# Patient Record
Sex: Male | Born: 1960 | Race: White | Hispanic: No | Marital: Single | Smoking: Never smoker
Health system: Southern US, Community
[De-identification: ages and names within clinical notes are randomized; demographics above are authoritative.]

## PROBLEM LIST (undated history)

## (undated) DIAGNOSIS — F329 Major depressive disorder, single episode, unspecified: Secondary | ICD-10-CM

## (undated) DIAGNOSIS — F32A Depression, unspecified: Secondary | ICD-10-CM

---

## 2019-01-14 ENCOUNTER — Emergency Department (HOSPITAL_COMMUNITY): Payer: BC Managed Care – PPO

## 2019-01-14 ENCOUNTER — Other Ambulatory Visit: Payer: Self-pay

## 2019-01-14 ENCOUNTER — Inpatient Hospital Stay (HOSPITAL_COMMUNITY)
Admission: EM | Admit: 2019-01-14 | Discharge: 2019-01-15 | DRG: 605 | Disposition: A | Discharge status: Other Acute Inpatient Hospital | Payer: BC Managed Care – PPO | Attending: General Surgery | Admitting: General Surgery

## 2019-01-14 ENCOUNTER — Encounter (HOSPITAL_COMMUNITY): Payer: Self-pay | Admitting: Emergency Medicine

## 2019-01-14 DIAGNOSIS — Z66 Do not resuscitate: Secondary | ICD-10-CM | POA: Diagnosis present

## 2019-01-14 DIAGNOSIS — F1024 Alcohol dependence with alcohol-induced mood disorder: Secondary | ICD-10-CM | POA: Diagnosis not present

## 2019-01-14 DIAGNOSIS — F10239 Alcohol dependence with withdrawal, unspecified: Secondary | ICD-10-CM | POA: Diagnosis not present

## 2019-01-14 DIAGNOSIS — T1491XA Suicide attempt, initial encounter: Secondary | ICD-10-CM | POA: Diagnosis not present

## 2019-01-14 DIAGNOSIS — F419 Anxiety disorder, unspecified: Secondary | ICD-10-CM | POA: Diagnosis present

## 2019-01-14 DIAGNOSIS — X781XXA Intentional self-harm by knife, initial encounter: Secondary | ICD-10-CM | POA: Diagnosis not present

## 2019-01-14 DIAGNOSIS — F322 Major depressive disorder, single episode, severe without psychotic features: Secondary | ICD-10-CM | POA: Diagnosis not present

## 2019-01-14 DIAGNOSIS — F332 Major depressive disorder, recurrent severe without psychotic features: Secondary | ICD-10-CM | POA: Diagnosis not present

## 2019-01-14 DIAGNOSIS — Z7289 Other problems related to lifestyle: Secondary | ICD-10-CM

## 2019-01-14 DIAGNOSIS — Z915 Personal history of self-harm: Secondary | ICD-10-CM

## 2019-01-14 DIAGNOSIS — F329 Major depressive disorder, single episode, unspecified: Secondary | ICD-10-CM | POA: Diagnosis present

## 2019-01-14 DIAGNOSIS — S21119A Laceration without foreign body of unspecified front wall of thorax without penetration into thoracic cavity, initial encounter: Secondary | ICD-10-CM | POA: Diagnosis not present

## 2019-01-14 DIAGNOSIS — Z23 Encounter for immunization: Secondary | ICD-10-CM | POA: Diagnosis not present

## 2019-01-14 DIAGNOSIS — Z818 Family history of other mental and behavioral disorders: Secondary | ICD-10-CM

## 2019-01-14 DIAGNOSIS — Z6839 Body mass index (BMI) 39.0-39.9, adult: Secondary | ICD-10-CM

## 2019-01-14 DIAGNOSIS — S20219A Contusion of unspecified front wall of thorax, initial encounter: Secondary | ICD-10-CM | POA: Diagnosis present

## 2019-01-14 DIAGNOSIS — IMO0002 Reserved for concepts with insufficient information to code with codable children: Secondary | ICD-10-CM

## 2019-01-14 DIAGNOSIS — Z79899 Other long term (current) drug therapy: Secondary | ICD-10-CM

## 2019-01-14 DIAGNOSIS — E669 Obesity, unspecified: Secondary | ICD-10-CM | POA: Diagnosis present

## 2019-01-14 DIAGNOSIS — F17200 Nicotine dependence, unspecified, uncomplicated: Secondary | ICD-10-CM | POA: Diagnosis not present

## 2019-01-14 DIAGNOSIS — Y908 Blood alcohol level of 240 mg/100 ml or more: Secondary | ICD-10-CM | POA: Diagnosis present

## 2019-01-14 DIAGNOSIS — F10129 Alcohol abuse with intoxication, unspecified: Secondary | ICD-10-CM | POA: Diagnosis present

## 2019-01-14 DIAGNOSIS — K429 Umbilical hernia without obstruction or gangrene: Secondary | ICD-10-CM | POA: Diagnosis present

## 2019-01-14 DIAGNOSIS — F10920 Alcohol use, unspecified with intoxication, uncomplicated: Secondary | ICD-10-CM

## 2019-01-14 DIAGNOSIS — S21112A Laceration without foreign body of left front wall of thorax without penetration into thoracic cavity, initial encounter: Secondary | ICD-10-CM | POA: Diagnosis present

## 2019-01-14 DIAGNOSIS — F4310 Post-traumatic stress disorder, unspecified: Secondary | ICD-10-CM | POA: Diagnosis not present

## 2019-01-14 DIAGNOSIS — G4700 Insomnia, unspecified: Secondary | ICD-10-CM | POA: Diagnosis not present

## 2019-01-14 DIAGNOSIS — J301 Allergic rhinitis due to pollen: Secondary | ICD-10-CM | POA: Diagnosis not present

## 2019-01-14 HISTORY — DX: Depression, unspecified: F32.A

## 2019-01-14 HISTORY — DX: Major depressive disorder, single episode, unspecified: F32.9

## 2019-01-14 LAB — CBC
HCT: 47.6 % (ref 39.0–52.0)
HCT: 40.1 % (ref 39.0–52.0)
Hemoglobin: 15.6 g/dL (ref 13.0–17.0)
Hemoglobin: 13.5 g/dL (ref 13.0–17.0)
MCH: 31.1 pg (ref 26.0–34.0)
MCH: 31 pg (ref 26.0–34.0)
MCHC: 32.8 g/dL (ref 30.0–36.0)
MCHC: 33.7 g/dL (ref 30.0–36.0)
MCV: 94.8 fL (ref 80.0–100.0)
MCV: 92 fL (ref 80.0–100.0)
Platelets: 246 10*3/uL (ref 150–400)
Platelets: 197 10*3/uL (ref 150–400)
RBC: 5.02 MIL/uL (ref 4.22–5.81)
RBC: 4.36 MIL/uL (ref 4.22–5.81)
RDW: 13 % (ref 11.5–15.5)
RDW: 12.9 % (ref 11.5–15.5)
WBC: 11.4 10*3/uL — ABNORMAL HIGH (ref 4.0–10.5)
WBC: 11.3 10*3/uL — ABNORMAL HIGH (ref 4.0–10.5)
nRBC: 0 % (ref 0.0–0.2)
nRBC: 0 % (ref 0.0–0.2)

## 2019-01-14 LAB — PREPARE FRESH FROZEN PLASMA
Unit division: 0
Unit division: 0

## 2019-01-14 LAB — URINALYSIS, ROUTINE W REFLEX MICROSCOPIC
Bacteria, UA: NONE SEEN
Bilirubin Urine: NEGATIVE
Glucose, UA: NEGATIVE mg/dL
Hgb urine dipstick: NEGATIVE
Ketones, ur: 80 mg/dL — AB
Leukocytes,Ua: NEGATIVE
Nitrite: NEGATIVE
Protein, ur: 30 mg/dL — AB
Specific Gravity, Urine: 1.024 (ref 1.005–1.030)
pH: 5 (ref 5.0–8.0)

## 2019-01-14 LAB — TYPE AND SCREEN
ABO/RH(D): O POS
ANTIBODY SCREEN: NEGATIVE
Unit division: 0
Unit division: 0

## 2019-01-14 LAB — GLUCOSE, CAPILLARY: Glucose-Capillary: 151 mg/dL — ABNORMAL HIGH (ref 70–99)

## 2019-01-14 LAB — COMPREHENSIVE METABOLIC PANEL
ALT: 24 U/L (ref 0–44)
AST: 32 U/L (ref 15–41)
Albumin: 4.5 g/dL (ref 3.5–5.0)
Alkaline Phosphatase: 80 U/L (ref 38–126)
Anion gap: 15 (ref 5–15)
BUN: 21 mg/dL — AB (ref 6–20)
CO2: 22 mmol/L (ref 22–32)
Calcium: 8.7 mg/dL — ABNORMAL LOW (ref 8.9–10.3)
Chloride: 97 mmol/L — ABNORMAL LOW (ref 98–111)
Creatinine, Ser: 1.04 mg/dL (ref 0.61–1.24)
GFR calc Af Amer: >60 mL/min (ref >60)
GFR calc non Af Amer: >60 mL/min (ref >60)
Glucose, Bld: 148 mg/dL — ABNORMAL HIGH (ref 70–99)
Potassium: 3.5 mmol/L (ref 3.5–5.1)
Sodium: 134 mmol/L — ABNORMAL LOW (ref 135–145)
Total Bilirubin: 1.1 mg/dL (ref 0.3–1.2)
Total Protein: 7.6 g/dL (ref 6.5–8.1)

## 2019-01-14 LAB — RAPID URINE DRUG SCREEN, HOSP PERFORMED
Amphetamines: NOT DETECTED
BARBITURATES: NOT DETECTED
Benzodiazepines: POSITIVE — AB
Cocaine: NOT DETECTED
Opiates: POSITIVE — AB
Tetrahydrocannabinol: NOT DETECTED

## 2019-01-14 LAB — BPAM RBC
Blood Product Expiration Date: 202004172359
Blood Product Expiration Date: 202004172359
ISSUE DATE / TIME: 202003310153
ISSUE DATE / TIME: 202003310449
Unit Type and Rh: 5100
Unit Type and Rh: 5100

## 2019-01-14 LAB — BPAM FFP
Blood Product Expiration Date: 202004042359
Blood Product Expiration Date: 202004052359
ISSUE DATE / TIME: 202003310153
ISSUE DATE / TIME: 202003310153
Unit Type and Rh: 6200
Unit Type and Rh: 6200

## 2019-01-14 LAB — CDS SEROLOGY

## 2019-01-14 LAB — BLOOD PRODUCT ORDER (VERBAL) VERIFICATION

## 2019-01-14 LAB — HIV ANTIBODY (ROUTINE TESTING W REFLEX): HIV Screen 4th Generation wRfx: NONREACTIVE

## 2019-01-14 LAB — ETHANOL: Alcohol, Ethyl (B): 289 mg/dL — ABNORMAL HIGH (ref <10)

## 2019-01-14 LAB — LACTIC ACID, PLASMA: Lactic Acid, Venous: 5 mmol/L (ref 0.5–1.9)

## 2019-01-14 LAB — PROTIME-INR
INR: 1 (ref 0.8–1.2)
Prothrombin Time: 12.6 seconds (ref 11.4–15.2)

## 2019-01-14 LAB — ABO/RH: ABO/RH(D): O POS

## 2019-01-14 MED ORDER — FOLIC ACID 5 MG/ML IJ SOLN
1.0000 mg | Freq: Once | INTRAMUSCULAR | Status: AC
  Administered 2019-01-14: 1 mg via INTRAVENOUS
  Filled 2019-01-14: qty 0.2

## 2019-01-14 MED ORDER — FOLIC ACID 1 MG PO TABS
1.0000 mg | ORAL_TABLET | Freq: Every day | ORAL | Status: DC
  Administered 2019-01-14 – 2019-01-15 (×2): 1 mg via ORAL
  Filled 2019-01-14 (×2): qty 1

## 2019-01-14 MED ORDER — SODIUM CHLORIDE 0.9 % IV SOLN
INTRAVENOUS | Status: DC
  Administered 2019-01-14 – 2019-01-15 (×3): via INTRAVENOUS

## 2019-01-14 MED ORDER — ACETAMINOPHEN 325 MG PO TABS: 650.0000 mg | ORAL_TABLET | ORAL | Status: DC | PRN

## 2019-01-14 MED ORDER — ONDANSETRON HCL 4 MG/2ML IJ SOLN: 4.0000 mg | Freq: Four times a day (QID) | INTRAMUSCULAR | Status: DC | PRN

## 2019-01-14 MED ORDER — SODIUM CHLORIDE 0.9 % IV BOLUS (SEPSIS)
1000.0000 mL | Freq: Once | INTRAVENOUS | Status: AC
  Administered 2019-01-14: 1000 mL via INTRAVENOUS

## 2019-01-14 MED ORDER — FENTANYL CITRATE (PF) 100 MCG/2ML IJ SOLN
50.0000 ug | Freq: Once | INTRAMUSCULAR | Status: AC
  Administered 2019-01-14: 50 ug via INTRAVENOUS
  Filled 2019-01-14: qty 2

## 2019-01-14 MED ORDER — ONDANSETRON 4 MG PO TBDP: 4.0000 mg | ORAL_TABLET | Freq: Four times a day (QID) | ORAL | Status: DC | PRN

## 2019-01-14 MED ORDER — SODIUM CHLORIDE 0.9 % IV SOLN: 1.0000 mg | Freq: Once | INTRAVENOUS | Status: DC

## 2019-01-14 MED ORDER — ONDANSETRON HCL 4 MG/2ML IJ SOLN
4.0000 mg | Freq: Once | INTRAMUSCULAR | Status: AC
  Administered 2019-01-14: 4 mg via INTRAVENOUS
  Filled 2019-01-14: qty 2

## 2019-01-14 MED ORDER — TETANUS-DIPHTH-ACELL PERTUSSIS 5-2.5-18.5 LF-MCG/0.5 IM SUSP
0.5000 mL | Freq: Once | INTRAMUSCULAR | Status: AC
  Administered 2019-01-14: 0.5 mL via INTRAMUSCULAR
  Filled 2019-01-14: qty 0.5

## 2019-01-14 MED ORDER — LORAZEPAM 1 MG PO TABS: 1.0000 mg | ORAL_TABLET | Freq: Four times a day (QID) | ORAL | Status: DC | PRN

## 2019-01-14 MED ORDER — ENOXAPARIN SODIUM 40 MG/0.4ML ~~LOC~~ SOLN
40.0000 mg | SUBCUTANEOUS | Status: DC
  Administered 2019-01-14: 40 mg via SUBCUTANEOUS
  Filled 2019-01-14: qty 0.4

## 2019-01-14 MED ORDER — IOHEXOL 350 MG/ML SOLN
100.0000 mL | Freq: Once | INTRAVENOUS | Status: AC | PRN
  Administered 2019-01-14: 100 mL via INTRAVENOUS

## 2019-01-14 MED ORDER — OXYCODONE HCL 5 MG PO TABS: 5.0000 mg | ORAL_TABLET | ORAL | Status: DC | PRN

## 2019-01-14 MED ORDER — ADULT MULTIVITAMIN W/MINERALS CH
1.0000 | ORAL_TABLET | Freq: Every day | ORAL | Status: DC
  Administered 2019-01-14 – 2019-01-15 (×2): 1 via ORAL
  Filled 2019-01-14 (×2): qty 1

## 2019-01-14 MED ORDER — CEFAZOLIN SODIUM-DEXTROSE 2-4 GM/100ML-% IV SOLN
2.0000 g | Freq: Once | INTRAVENOUS | Status: AC
  Administered 2019-01-14: 2 g via INTRAVENOUS
  Filled 2019-01-14: qty 100

## 2019-01-14 MED ORDER — BUSPIRONE HCL 15 MG PO TABS
7.5000 mg | ORAL_TABLET | Freq: Two times a day (BID) | ORAL | Status: DC
  Administered 2019-01-14 – 2019-01-15 (×2): 7.5 mg via ORAL
  Filled 2019-01-14 (×3): qty 1

## 2019-01-14 MED ORDER — VITAMIN B-1 100 MG PO TABS
100.0000 mg | ORAL_TABLET | Freq: Every day | ORAL | Status: DC
  Administered 2019-01-14 – 2019-01-15 (×2): 100 mg via ORAL
  Filled 2019-01-14 (×2): qty 1

## 2019-01-14 MED ORDER — THIAMINE HCL 100 MG/ML IJ SOLN: 100.0000 mg | Freq: Every day | INTRAMUSCULAR | Status: DC

## 2019-01-14 MED ORDER — LORAZEPAM 2 MG/ML IJ SOLN
1.0000 mg | Freq: Four times a day (QID) | INTRAMUSCULAR | Status: DC | PRN
  Administered 2019-01-14: 1 mg via INTRAVENOUS
  Filled 2019-01-14: qty 1

## 2019-01-14 MED ORDER — SERTRALINE HCL 100 MG PO TABS
200.0000 mg | ORAL_TABLET | Freq: Every day | ORAL | Status: DC
  Administered 2019-01-14 – 2019-01-15 (×2): 200 mg via ORAL
  Filled 2019-01-14 (×2): qty 2

## 2019-01-14 MED ORDER — BACITRACIN ZINC 500 UNIT/GM EX OINT
TOPICAL_OINTMENT | Freq: Every day | CUTANEOUS | Status: DC
  Administered 2019-01-14 – 2019-01-15 (×2): via TOPICAL
  Filled 2019-01-14: qty 28.35

## 2019-01-14 MED ORDER — DOCUSATE SODIUM 100 MG PO CAPS
100.0000 mg | ORAL_CAPSULE | Freq: Two times a day (BID) | ORAL | Status: DC
  Administered 2019-01-14 – 2019-01-15 (×3): 100 mg via ORAL
  Filled 2019-01-14 (×3): qty 1

## 2019-01-14 MED ORDER — MORPHINE SULFATE (PF) 2 MG/ML IV SOLN
1.0000 mg | INTRAVENOUS | Status: DC | PRN
  Administered 2019-01-14: 2 mg via INTRAVENOUS
  Filled 2019-01-14: qty 1

## 2019-01-14 MED ORDER — HYDRALAZINE HCL 20 MG/ML IJ SOLN: 10.0000 mg | INTRAMUSCULAR | Status: DC | PRN
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
## 2019-01-15 ENCOUNTER — Other Ambulatory Visit: Payer: Self-pay | Admitting: Registered Nurse

## 2019-01-15 ENCOUNTER — Inpatient Hospital Stay (HOSPITAL_COMMUNITY)
Admission: AD | Admit: 2019-01-15 | Discharge: 2019-01-20 | DRG: 885 | Disposition: A | Discharge status: Home / Self Care | Payer: BC Managed Care – PPO | Source: Intra-hospital | Attending: Psychiatry | Admitting: Psychiatry

## 2019-01-15 ENCOUNTER — Other Ambulatory Visit: Payer: Self-pay

## 2019-01-15 ENCOUNTER — Encounter (HOSPITAL_COMMUNITY): Payer: Self-pay | Admitting: *Deleted

## 2019-01-15 DIAGNOSIS — J301 Allergic rhinitis due to pollen: Secondary | ICD-10-CM | POA: Diagnosis present

## 2019-01-15 DIAGNOSIS — Z915 Personal history of self-harm: Secondary | ICD-10-CM | POA: Diagnosis not present

## 2019-01-15 DIAGNOSIS — F1024 Alcohol dependence with alcohol-induced mood disorder: Secondary | ICD-10-CM | POA: Diagnosis present

## 2019-01-15 DIAGNOSIS — T1491XA Suicide attempt, initial encounter: Secondary | ICD-10-CM | POA: Diagnosis not present

## 2019-01-15 DIAGNOSIS — F17200 Nicotine dependence, unspecified, uncomplicated: Secondary | ICD-10-CM | POA: Diagnosis present

## 2019-01-15 DIAGNOSIS — X781XXA Intentional self-harm by knife, initial encounter: Secondary | ICD-10-CM

## 2019-01-15 DIAGNOSIS — F10239 Alcohol dependence with withdrawal, unspecified: Secondary | ICD-10-CM | POA: Diagnosis present

## 2019-01-15 DIAGNOSIS — F332 Major depressive disorder, recurrent severe without psychotic features: Secondary | ICD-10-CM | POA: Diagnosis not present

## 2019-01-15 DIAGNOSIS — F4310 Post-traumatic stress disorder, unspecified: Secondary | ICD-10-CM | POA: Diagnosis present

## 2019-01-15 DIAGNOSIS — Y908 Blood alcohol level of 240 mg/100 ml or more: Secondary | ICD-10-CM | POA: Diagnosis present

## 2019-01-15 DIAGNOSIS — IMO0002 Reserved for concepts with insufficient information to code with codable children: Secondary | ICD-10-CM

## 2019-01-15 DIAGNOSIS — Z79899 Other long term (current) drug therapy: Secondary | ICD-10-CM | POA: Diagnosis not present

## 2019-01-15 DIAGNOSIS — G4700 Insomnia, unspecified: Secondary | ICD-10-CM | POA: Diagnosis present

## 2019-01-15 DIAGNOSIS — F322 Major depressive disorder, single episode, severe without psychotic features: Secondary | ICD-10-CM | POA: Diagnosis present

## 2019-01-15 DIAGNOSIS — Z7289 Other problems related to lifestyle: Secondary | ICD-10-CM

## 2019-01-15 DIAGNOSIS — S21119A Laceration without foreign body of unspecified front wall of thorax without penetration into thoracic cavity, initial encounter: Secondary | ICD-10-CM | POA: Diagnosis not present

## 2019-01-15 LAB — BASIC METABOLIC PANEL
Anion gap: 9 (ref 5–15)
BUN: 15 mg/dL (ref 6–20)
CO2: 23 mmol/L (ref 22–32)
Calcium: 8.4 mg/dL — ABNORMAL LOW (ref 8.9–10.3)
Chloride: 103 mmol/L (ref 98–111)
Creatinine, Ser: 0.83 mg/dL (ref 0.61–1.24)
GFR calc Af Amer: >60 mL/min (ref >60)
GFR calc non Af Amer: >60 mL/min (ref >60)
Glucose, Bld: 140 mg/dL — ABNORMAL HIGH (ref 70–99)
Potassium: 3.7 mmol/L (ref 3.5–5.1)
Sodium: 135 mmol/L (ref 135–145)

## 2019-01-15 LAB — CBC
HCT: 34.1 % — ABNORMAL LOW (ref 39.0–52.0)
HEMOGLOBIN: 11.7 g/dL — AB (ref 13.0–17.0)
MCH: 31.9 pg (ref 26.0–34.0)
MCHC: 34.3 g/dL (ref 30.0–36.0)
MCV: 92.9 fL (ref 80.0–100.0)
Platelets: 158 10*3/uL (ref 150–400)
RBC: 3.67 MIL/uL — ABNORMAL LOW (ref 4.22–5.81)
RDW: 12.8 % (ref 11.5–15.5)
WBC: 8.2 10*3/uL (ref 4.0–10.5)
nRBC: 0 % (ref 0.0–0.2)

## 2019-01-15 MED ORDER — LORAZEPAM 1 MG PO TABS: 1.0000 mg | ORAL_TABLET | Freq: Every day | ORAL | Status: DC

## 2019-01-15 MED ORDER — BUSPIRONE HCL 10 MG PO TABS
10.0000 mg | ORAL_TABLET | Freq: Two times a day (BID) | ORAL | Status: DC
  Administered 2019-01-15 – 2019-01-20 (×10): 10 mg via ORAL
  Filled 2019-01-15 (×6): qty 1
  Filled 2019-01-15: qty 2
  Filled 2019-01-15 (×8): qty 1

## 2019-01-15 MED ORDER — THIAMINE HCL 100 MG/ML IJ SOLN: 100.0000 mg | Freq: Once | INTRAMUSCULAR | Status: DC

## 2019-01-15 MED ORDER — ZOLPIDEM TARTRATE 5 MG PO TABS
5.0000 mg | ORAL_TABLET | Freq: Every evening | ORAL | Status: DC | PRN
  Administered 2019-01-15: 5 mg via ORAL
  Filled 2019-01-15: qty 1

## 2019-01-15 MED ORDER — BUSPIRONE HCL 7.5 MG PO TABS: 7.5000 mg | ORAL_TABLET | Freq: Two times a day (BID) | ORAL | Status: DC

## 2019-01-15 MED ORDER — BACITRACIN ZINC 500 UNIT/GM EX OINT: TOPICAL_OINTMENT | Freq: Every day | CUTANEOUS | 0 refills | Status: DC

## 2019-01-15 MED ORDER — BACITRACIN-NEOMYCIN-POLYMYXIN 400-5-5000 EX OINT
TOPICAL_OINTMENT | Freq: Three times a day (TID) | CUTANEOUS | Status: DC
  Filled 2019-01-15: qty 1

## 2019-01-15 MED ORDER — LORAZEPAM 1 MG PO TABS: 1.0000 mg | ORAL_TABLET | Freq: Three times a day (TID) | ORAL | 0 refills | Status: DC | PRN

## 2019-01-15 MED ORDER — LORAZEPAM 1 MG PO TABS: 1.0000 mg | ORAL_TABLET | Freq: Two times a day (BID) | ORAL | Status: DC

## 2019-01-15 MED ORDER — ACETAMINOPHEN 325 MG PO TABS: 650.0000 mg | ORAL_TABLET | ORAL | Status: DC | PRN

## 2019-01-15 MED ORDER — LOPERAMIDE HCL 2 MG PO CAPS: 2.0000 mg | ORAL_CAPSULE | ORAL | Status: AC | PRN

## 2019-01-15 MED ORDER — SERTRALINE HCL 100 MG PO TABS
100.0000 mg | ORAL_TABLET | Freq: Two times a day (BID) | ORAL | Status: DC
  Administered 2019-01-15 – 2019-01-17 (×4): 100 mg via ORAL
  Filled 2019-01-15 (×7): qty 1

## 2019-01-15 MED ORDER — LORAZEPAM 1 MG PO TABS: 1.0000 mg | ORAL_TABLET | Freq: Three times a day (TID) | ORAL | Status: DC

## 2019-01-15 MED ORDER — TRAZODONE HCL 50 MG PO TABS
50.0000 mg | ORAL_TABLET | Freq: Every evening | ORAL | Status: DC | PRN
  Administered 2019-01-15: 50 mg via ORAL
  Filled 2019-01-15 (×7): qty 1

## 2019-01-15 MED ORDER — LORAZEPAM 1 MG PO TABS
1.0000 mg | ORAL_TABLET | Freq: Four times a day (QID) | ORAL | Status: AC | PRN
  Administered 2019-01-15: 1 mg via ORAL
  Filled 2019-01-15: qty 1

## 2019-01-15 MED ORDER — LORAZEPAM 1 MG PO TABS: 1.0000 mg | ORAL_TABLET | Freq: Four times a day (QID) | ORAL | Status: DC

## 2019-01-15 MED ORDER — ADULT MULTIVITAMIN W/MINERALS CH
1.0000 | ORAL_TABLET | Freq: Every day | ORAL | Status: DC
  Administered 2019-01-16 – 2019-01-20 (×5): 1 via ORAL
  Filled 2019-01-15 (×7): qty 1

## 2019-01-15 MED ORDER — HYDROXYZINE HCL 25 MG PO TABS
25.0000 mg | ORAL_TABLET | Freq: Four times a day (QID) | ORAL | Status: AC | PRN
  Administered 2019-01-15 – 2019-01-16 (×2): 25 mg via ORAL
  Filled 2019-01-15 (×3): qty 1

## 2019-01-15 MED ORDER — ONDANSETRON 4 MG PO TBDP: 4.0000 mg | ORAL_TABLET | Freq: Four times a day (QID) | ORAL | Status: AC | PRN

## 2019-01-15 MED ORDER — VITAMIN B-1 100 MG PO TABS
100.0000 mg | ORAL_TABLET | Freq: Every day | ORAL | Status: DC
  Administered 2019-01-16 – 2019-01-20 (×5): 100 mg via ORAL
  Filled 2019-01-15 (×7): qty 1
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
## 2019-01-15 NOTE — Plan of Care (Signed)
  Problem: Health Behavior/Discharge (Transition) Planning: Goal: Ability to manage health-related needs will improve Outcome: Progressing   Problem: Clinical Measurements: Goal: Remain free from any harm during hospitalization Outcome: Progressing   Problem: Nutrition: Goal: Adequate fluids and nutrition will be maintained Outcome: Progressing   Problem: Sleep Hygiene: Goal: Ability to obtain adequate restful sleep will improve Outcome: Progressing   Problem: Education: Goal: Knowledge of General Education information will improve Description Including pain rating scale, medication(s)/side effects and non-pharmacologic comfort measures Outcome: Progressing   Problem: Health Behavior/Discharge Planning: Goal: Ability to manage health-related needs will improve Outcome: Progressing   Problem: Clinical Measurements: Goal: Will remain free from infection Outcome: Progressing Goal: Diagnostic test results will improve Outcome: Progressing Goal: Cardiovascular complication will be avoided Outcome: Progressing   Problem: Activity: Goal: Risk for activity intolerance will decrease Outcome: Progressing   Problem: Nutrition: Goal: Adequate nutrition will be maintained Outcome: Progressing   Problem: Coping: Goal: Level of anxiety will decrease Outcome: Progressing   Problem: Elimination: Goal: Will not experience complications related to bowel motility Outcome: Progressing Goal: Will not experience complications related to urinary retention Outcome: Progressing   Problem: Pain Managment: Goal: General experience of comfort will improve Outcome: Progressing   Problem: Safety: Goal: Ability to remain free from injury will improve Outcome: Progressing   Problem: Skin Integrity: Goal: Risk for impaired skin integrity will decrease Outcome: Progressing

## 2019-01-15 NOTE — Tx Team (Signed)
Initial Treatment Plan 01/15/2019 2:20 PM [place]ington Dohrn QIW:[id number]    PATIENT STRESSORS: Financial difficulties Occupational concerns Substance abuse   PATIENT STRENGTHS: Ability for insight Average or above average intelligence Capable of independent living General fund of knowledge Motivation for treatment/growth   PATIENT IDENTIFIED PROBLEMS: Depression Alcohol abuse Suicidal thoughts "Come up with a plan to stop drinking and balance my meds"                     DISCHARGE CRITERIA:  Ability to meet basic life and health needs Improved stabilization in mood, thinking, and/or behavior Reduction of life-threatening or endangering symptoms to within safe limits Verbal commitment to aftercare and medication compliance Withdrawal symptoms are absent or subacute and managed without 24-hour nursing intervention  PRELIMINARY DISCHARGE PLAN: Attend aftercare/continuing care group Return to previous living arrangement  PATIENT/FAMILY INVOLVEMENT: This treatment plan has been presented to and reviewed with the patient, Tanner Andersen, and/or family member, .  The patient and family have been given the opportunity to ask questions and make suggestions.  Stiven Kaspar, [place], [place] 01/15/2019, 2:20 PM
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
## 2019-01-15 NOTE — TOC Transition Note (Signed)
Transition of Care [facility]) - CM/SW Discharge Note   Patient Details  Name: Tanner Andersen MRN: [id number] Date of Birth: Sep 28, 1961  [facility]) CM/SW Contact:  Legrand Como, LCSW Phone Number: [phone] 01/15/2019, 12:25 PM   Clinical Narrative:    Patient with bed available at [facility] following Psychiatry recommendation for inpatient placement.  Patient remains agreeable with placement at [facility] and signed voluntary form for admission.  CSW to arrange [facility] transport for patient arrival at 13:00.  Patient details for admission are as follows:  Room 307 Bed 1 Dr. Sharma Covert - Accepting MD Dr. Jama Flavors - Admitting MD Number for Report [phone]   Final next level of care: Psychiatric Hospital Barriers to Discharge: No Barriers Identified   Patient Goals and CMS Choice Patient states their goals for this hospitalization and ongoing recovery are:: To get home and back to "normal"   Choice offered to / list presented to : Patient  Discharge Placement              Patient chooses bed at: Other - please specify in the comment section below:([facility]) Patient to be transferred to facility by: [place] Name of family member notified: Patient to notify family Patient and family notified of of transfer: 01/15/19  Discharge Plan and Services      Room 307 Bed 1 Dr. Sharma Covert - Accepting MD Dr. Jama Flavors - Admitting MD Number for Report [phone]                   Social Determinants of Health (SDOH) Interventions     Readmission Risk Interventions No flowsheet data found.

## 2019-01-15 NOTE — Progress Notes (Signed)
Discharge to Behavioral health transported by behavioral transport. Patient was alert and oriented prior to discharge, not in any distress.

## 2019-01-15 NOTE — Discharge Instructions (Signed)
On February 04, 2019 at 9:40am you will receive a phone call for your follow up visit to decrease your exposure during this time of COVID-19 being high.  You will need to take a picture of your wound and email it to [email or web address] the day prior to your appointment.  However, if you begin to noticed redness, purulent drainage, or fevers and feel like your wound is infected then please call so we can adjust this appointment to a possible face to face encounter.     Bacitracin ointment to chest wound and cover with a dry bandage

## 2019-01-15 NOTE — Progress Notes (Signed)
Report given to behavioral health Marchelle Folks, RN

## 2019-01-15 NOTE — Progress Notes (Deleted)
Tanner Andersen to be D/C'd  per MD order. Discussed with the patient and all questions fully answered.  VSS, Skin clean, dry and intact without evidence of skin break down, no evidence of skin tears noted.  IV catheter discontinued intact. Site without signs and symptoms of complications. Dressing and pressure applied.  An After Visit Summary was printed and given to the patient. Patient received prescription.  D/c education completed with patient/family including follow up instructions, medication list, d/c activities limitations if indicated, with other d/c instructions as indicated by MD - patient able to verbalize understanding, all questions fully answered.   Patient instructed to return to ED, call 911, or call MD for any changes in condition.   Patient to be escorted via WC, and D/C home via private auto.

## 2019-01-15 NOTE — Progress Notes (Signed)
EKG completed and given to MD for review.  
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
## 2019-01-16 LAB — HEMOGLOBIN A1C
Hgb A1c MFr Bld: 5.7 % — ABNORMAL HIGH (ref 4.8–5.6)
Mean Plasma Glucose: 116.89 mg/dL

## 2019-01-16 LAB — LIPID PANEL
Cholesterol: 183 mg/dL (ref 0–200)
HDL: 67 mg/dL (ref >40)
LDL Cholesterol: 77 mg/dL (ref 0–99)
Total CHOL/HDL Ratio: 2.7 RATIO
Triglycerides: 197 mg/dL — ABNORMAL HIGH (ref <150)
VLDL: 39 mg/dL (ref 0–40)

## 2019-01-16 LAB — TSH: TSH: 3.297 u[IU]/mL (ref 0.350–4.500)

## 2019-01-16 MED ORDER — BACITRACIN-NEOMYCIN-POLYMYXIN OINTMENT TUBE
TOPICAL_OINTMENT | Freq: Three times a day (TID) | CUTANEOUS | Status: DC
  Administered 2019-01-16: 1 via TOPICAL
  Administered 2019-01-17 – 2019-01-20 (×5): via TOPICAL
  Filled 2019-01-16: qty 14.17

## 2019-01-16 MED ORDER — TRAZODONE HCL 50 MG PO TABS
50.0000 mg | ORAL_TABLET | Freq: Every evening | ORAL | Status: DC | PRN
  Administered 2019-01-16: 50 mg via ORAL
  Filled 2019-01-16: qty 1

## 2019-01-16 MED ORDER — BACITRACIN-NEOMYCIN-POLYMYXIN OINTMENT TUBE
TOPICAL_OINTMENT | CUTANEOUS | Status: AC
  Filled 2019-01-16: qty 14.17

## 2019-01-16 NOTE — Progress Notes (Signed)
Patient ID: Tanner Andersen, male   DOB: 24-Apr-1961, 58 y.o.   MRN: [id number] Wound cleaned and dressing changed. Wound is dry with no discharge. 4/4, neosporin and paper tape applied. Pt denies pain.

## 2019-01-16 NOTE — BHH Group Notes (Signed)
LCSW Group Therapy Notes 01/16/2019 2:02 PM  Type of Therapy and Topic: Group Therapy: Overcoming Obstacles  Participation Level: Did Not Attend  Description of Group:  In this group patients will be encouraged to explore what they see as obstacles to their own wellness and recovery. They will be guided to discuss their thoughts, feelings, and behaviors related to these obstacles. The group will process together ways to cope with barriers, with attention given to specific choices patients can make. Each patient will be challenged to identify changes they are motivated to make in order to overcome their obstacles. This group will be process-oriented, with patients participating in exploration of their own experiences as well as giving and receiving support and challenge from other group members.  Therapeutic Goals: 1. Patient will identify personal and current obstacles as they relate to admission. 2. Patient will identify barriers that currently interfere with their wellness or overcoming obstacles.  3. Patient will identify feelings, thought process and behaviors related to these barriers. 4. Patient will identify two changes they are willing to make to overcome these obstacles:   Summary of Patient Progress Invited, did not attend.   Therapeutic Modalities:  Cognitive Behavioral Therapy Solution Focused Therapy Motivational Interviewing Relapse Prevention Therapy  Keauna Brasel, MSW, LCSWA 01/16/2019 2:02 PM   

## 2019-01-16 NOTE — Plan of Care (Signed)
Problem: Education: Goal: Knowledge of [facility] Education information/materials will improve Outcome: Progressing   Problem: Activity: Goal: Interest or engagement in activities will improve Outcome: Progressing   Problem: Safety: Goal: Periods of time without injury will increase Outcome: Progressing DAR Note: Pt observed in dayroom for majority of this shift. He attended groups as scheduled. Reports he slept well last night "that was the best sleep I've had in days", states his appetite is good. Denies SI, HI, AVH and pain. Rates his anxiety 5/10 and depression 5/10. Pt has been medication compliant, denies adverse drug reactions when assessed. Emotional support and encouragement offered to pt as needed throughout this shift. Scheduled and PRN medications given per MD's order's with verbal education and effects monitored. Dressing change done per order. Pt tolerate dressing change and all PO intake well. POC continues for safety and mood stability. Pt remains safe on and off unit.
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
## 2019-01-16 NOTE — Progress Notes (Signed)
D: Pt was in the dayroom upon initial approach.  Pt presents with depressed affect and mood.  He describes his day as "pretty good."  He reports he "talked to the doc about meds and possibly changing them up and I talked to the social worker."  Pt reports he discussed his aftercare plans with Education officer, museum.  Pt denies SI/HI, denies hallucinations, denies pain, denies withdrawal symptoms.  Pt has been visible in milieu interacting with peers and staff appropriately.   A: Introduced self to pt.  Met with pt 1:1.  Actively listened to pt and offered support and encouragement.  PRN medication administered for sleep.  Q15 minute safety checks maintained.  R: Pt is safe on the unit.  Pt is compliant with medication.  Pt verbally contracts for safety.  Will continue to monitor and assess.
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
## 2019-01-17 MED ORDER — TRAZODONE HCL 50 MG PO TABS
25.0000 mg | ORAL_TABLET | Freq: Every evening | ORAL | Status: DC | PRN
  Administered 2019-01-17 – 2019-01-19 (×3): 25 mg via ORAL
  Filled 2019-01-17 (×3): qty 1

## 2019-01-17 MED ORDER — ACAMPROSATE CALCIUM 333 MG PO TBEC
666.0000 mg | DELAYED_RELEASE_TABLET | Freq: Three times a day (TID) | ORAL | Status: DC
  Administered 2019-01-17 – 2019-01-20 (×9): 666 mg via ORAL
  Filled 2019-01-17 (×14): qty 2

## 2019-01-17 MED ORDER — CITALOPRAM HYDROBROMIDE 10 MG PO TABS
10.0000 mg | ORAL_TABLET | Freq: Every day | ORAL | Status: DC
  Administered 2019-01-18 – 2019-01-20 (×3): 10 mg via ORAL
  Filled 2019-01-17 (×5): qty 1
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
## 2019-01-17 NOTE — Progress Notes (Signed)
D: Pt was in dayroom upon initial approach.  Pt presents with depressed affect and mood.  He brightens with interaction.  He reports he is "doing good" and discusses how his medications were changed today because "I realized I needed a change."  Pt reports he "got outside twice today."  Pt denies SI/HI, denies hallucinations, denies pain.  Pt has been visible in milieu interacting with peers and staff appropriately.  Pt attended evening group.    A: Introduced self to pt.  Met with pt 1:1.  Actively listened to pt and offered support and encouragement.  PRN medication administered for sleep.  Pt provided with journal and workbook.  Q15 minute safety checks maintained.  R: Pt is safe on the unit.  Pt is compliant with medications.  Pt verbally contracts for safety.  Will continue to monitor and assess.

## 2019-01-17 NOTE — Progress Notes (Signed)
Recreation Therapy Notes  Date:  4.3.20 Time: 0930 Location: [address] Dayroom  Group Topic: Stress Management  Goal Area(s) Addresses:  Patient will identify positive stress management techniques. Patient will identify benefits of using stress management post d/c.  Behavioral Response:  Engaged  Intervention: Stress Management  Activity :  Guided Imagery.  LRT read Andersen script that guided patients in envisioning sitting in Andersen field and enjoying summer clouds.  Patients were to follow along as script was read to participate in meditation.  Education:  Stress Management, Discharge Planning.   Education Outcome: Acknowledges Education  Clinical Observations/Feedback:  Pt attended and participated in group.     Tanner Andersen, Tanner Andersen         Tanner Andersen 01/17/2019 11:49 AM 

## 2019-01-17 NOTE — Plan of Care (Addendum)
Patient was pleasant upon approach. Says he slept well, but the Trazodone dosage may have been too much. Denies SI HI AVH. Not quite ready for discharge, but thinks he is getting better. Safety is maintained with 15 minute checks as well as environmental checks. Will continue to monitor.  Problem: Education: Goal: Emotional status will improve Outcome: Progressing Goal: Mental status will improve Outcome: Progressing Goal: Verbalization of understanding the information provided will improve Outcome: Progressing

## 2019-01-17 NOTE — B[facility] Group Notes (Signed)
Adult Psychoeducational Group Note  Date:  01/17/2019 Time:  8:43 PM  Group Topic/Focus:  Wrap-Up Group:   The focus of this group is to help patients review their daily goal of treatment and discuss progress on daily workbooks.  Participation Level:  Active  Participation Quality:  Appropriate and Attentive  Affect:  Appropriate  Cognitive:  Alert and Appropriate  Insight: Appropriate and Good  Engagement in Group:  Engaged  Modes of Intervention:  Discussion and Education  Additional Comments:  Pt attended and participated in wrap up group this evening. Pt rated their day a 7/10. Pt goal is to make a plan for themselves, follow their plan, accept and write things down. Pt comment is that the employees are great, group, chats and food are also great.     Chrisandra Netters 01/17/2019, 8:43 PM

## 2019-01-17 NOTE — Tx Team (Signed)
Interdisciplinary Treatment and Diagnostic Plan Update  01/17/2019 Time of Session: 9:00am Tanner Andersen MRN: [id number]  Principal Diagnosis: <principal problem not specified>  Secondary Diagnoses: Active Problems:   MDD (major depressive disorder), severe (HCC)   Current Medications:  Current Facility-Administered Medications  Medication Dose Route Frequency Provider Last Rate Last Dose  . busPIRone (BUSPAR) tablet 10 mg  10 mg Oral BID Cobos, Rockey Situ, MD   10 mg at 01/17/19 0739  . hydrOXYzine (ATARAX/VISTARIL) tablet 25 mg  25 mg Oral Q6H PRN Cobos, Rockey Situ, MD   25 mg at 01/16/19 1728  . loperamide (IMODIUM) capsule 2-4 mg  2-4 mg Oral PRN Cobos, Rockey Situ, MD      . LORazepam (ATIVAN) tablet 1 mg  1 mg Oral Q6H PRN Cobos, Rockey Situ, MD   1 mg at 01/15/19 1727  . multivitamin with minerals tablet 1 tablet  1 tablet Oral Daily Cobos, Rockey Situ, MD   1 tablet at 01/17/19 0739  . neomycin-bacitracin-polymyxin (NEOSPORIN) ointment   Topical TID Kerry Hough, PA-C   1 application at 01/16/19 1713  . ondansetron (ZOFRAN-ODT) disintegrating tablet 4 mg  4 mg Oral Q6H PRN Cobos, Fernando A, MD      . sertraline (ZOLOFT) tablet 100 mg  100 mg Oral BID Cobos, Rockey Situ, MD   100 mg at 01/17/19 0739  . thiamine (VITAMIN B-1) tablet 100 mg  100 mg Oral Daily Cobos, Rockey Situ, MD   100 mg at 01/17/19 0739  . traZODone (DESYREL) tablet 50 mg  50 mg Oral QHS PRN Cobos, Rockey Situ, MD   50 mg at 01/16/19 2103   PTA Medications: Medications Prior to Admission  Medication Sig Dispense Refill Last Dose  . ALPRAZolam (XANAX) 1 MG tablet Take 1 mg by mouth daily as needed for anxiety.     . sertraline (ZOLOFT) 100 MG tablet Take 200 mg by mouth daily.   unk  . zolpidem (AMBIEN) 10 MG tablet Take 1 tablet by mouth at bedtime as needed for sleep.   unk    Patient Stressors: Financial difficulties Occupational concerns Substance abuse  Patient Strengths: Ability for insight Average  or above average intelligence Capable of independent living [employer] of knowledge Motivation for treatment/growth  Treatment Modalities: Medication Management, Group therapy, Case management,  1 to 1 session with clinician, Psychoeducation, Recreational therapy.   Physician Treatment Plan for Primary Diagnosis: <principal problem not specified> Long Term Goal(s): Improvement in symptoms so as ready for discharge Improvement in symptoms so as ready for discharge   Short Term Goals: Ability to identify changes in lifestyle to reduce recurrence of condition will improve Ability to verbalize feelings will improve Ability to disclose and discuss suicidal ideas Ability to demonstrate self-control will improve Ability to identify and develop effective coping behaviors will improve Ability to maintain clinical measurements within normal limits will improve Ability to identify triggers associated with substance abuse/mental health issues will improve  Medication Management: Evaluate patient's response, side effects, and tolerance of medication regimen.  Therapeutic Interventions: 1 to 1 sessions, Unit Group sessions and Medication administration.  Evaluation of Outcomes: Progressing  Physician Treatment Plan for Secondary Diagnosis: Active Problems:   MDD (major depressive disorder), severe (HCC)  Long Term Goal(s): Improvement in symptoms so as ready for discharge Improvement in symptoms so as ready for discharge   Short Term Goals: Ability to identify changes in lifestyle to reduce recurrence of condition will improve Ability to verbalize feelings will improve Ability  to disclose and discuss suicidal ideas Ability to demonstrate self-control will improve Ability to identify and develop effective coping behaviors will improve Ability to maintain clinical measurements within normal limits will improve Ability to identify triggers associated with substance abuse/mental health issues  will improve     Medication Management: Evaluate patient's response, side effects, and tolerance of medication regimen.  Therapeutic Interventions: 1 to 1 sessions, Unit Group sessions and Medication administration.  Evaluation of Outcomes: Progressing   RN Treatment Plan for Primary Diagnosis: <principal problem not specified> Long Term Goal(s): Knowledge of disease and therapeutic regimen to maintain health will improve  Short Term Goals: Ability to demonstrate self-control, Ability to verbalize feelings will improve and Ability to identify and develop effective coping behaviors will improve  Medication Management: RN will administer medications as ordered by provider, will assess and evaluate patient's response and provide education to patient for prescribed medication. RN will report any adverse and/or side effects to prescribing provider.  Therapeutic Interventions: 1 on 1 counseling sessions, Psychoeducation, Medication administration, Evaluate responses to treatment, Monitor vital signs and CBGs as ordered, Perform/monitor CIWA, COWS, AIMS and Fall Risk screenings as ordered, Perform wound care treatments as ordered.  Evaluation of Outcomes: Progressing   LCSW Treatment Plan for Primary Diagnosis: <principal problem not specified> Long Term Goal(s): Safe transition to appropriate next level of care at discharge, Engage patient in therapeutic group addressing interpersonal concerns.  Short Term Goals: Engage patient in aftercare planning with referrals and resources, Increase ability to appropriately verbalize feelings, Identify triggers associated with mental health/substance abuse issues and Increase skills for wellness and recovery  Therapeutic Interventions: Assess for all discharge needs, 1 to 1 time with Social worker, Explore available resources and support systems, Assess for adequacy in community support network, Educate family and significant other(s) on suicide prevention,  Complete Psychosocial Assessment, Interpersonal group therapy.  Evaluation of Outcomes: Progressing   Progress in Treatment: Attending groups: No. Participating in groups: No. Taking medication as prescribed: Yes. Toleration medication: Yes. Family/Significant other contact made: No, will contact:  sister Patient understands diagnosis: Yes. Discussing patient identified problems/goals with staff: Yes. Medical problems stabilized or resolved: Yes. Denies suicidal/homicidal ideation: Yes. Issues/concerns per patient self-inventory: Yes.  New problem(s) identified: Yes, Describe:  worried about employment and continued insurance coverage  New Short Term/Long Term Goal(s): detox, medication management for mood stabilization; elimination of SI thoughts; development of comprehensive mental wellness/sobriety plan.  Patient Goals: "Get my medications stabilized that work, detox (from alcohol)."  Discharge Plan or Barriers: Discharging home, [facility] for [facility] and therapy.  Reason for Continuation of Hospitalization: Anxiety Depression Withdrawal symptoms  Estimated Length of Stay: 1-3 days  Attendees: Patient: Tanner Andersen 01/17/2019 8:43 AM  Physician:  01/17/2019 8:43 AM  Nursing:  01/17/2019 8:43 AM  RN Care Manager: 01/17/2019 8:43 AM  Social Worker: Enid Cutter, [place] 01/17/2019 8:43 AM  Recreational Therapist:  01/17/2019 8:43 AM  Other:  01/17/2019 8:43 AM  Other:  01/17/2019 8:43 AM  Other: 01/17/2019 8:43 AM    Scribe for Treatment Team: Darreld Mclean, LCSWA 01/17/2019 8:43 AM

## 2019-01-17 NOTE — Progress Notes (Signed)
Adult Psychoeducational Group Note  Date:  01/17/2019 Time:  4:27 AM  Group Topic/Focus:  Wrap-Up Group:   The focus of this group is to help patients review their daily goal of treatment and discuss progress on daily workbooks.  Participation Level:  Active  Participation Quality:  Appropriate  Affect:  Appropriate  Cognitive:  Appropriate  Insight: Appropriate  Engagement in Group:  Engaged  Modes of Intervention:  Discussion  Additional Comments:  Pt said his day was a 7. The one positive thing that happened he was able to talk to the doctor about his medication.  Charna Busman Long 01/17/2019, 4:27 AM

## 2019-01-17 NOTE — Progress Notes (Signed)
Adult Psychoeducational Group Note  Date:  01/17/2019 Time:  4:29 AM  Group Topic/Focus:  Wrap-Up Group:   The focus of this group is to help patients review their daily goal of treatment and discuss progress on daily workbooks.  Participation Level:  Active  Participation Quality:  Appropriate  Affect:  Appropriate  Cognitive:  Appropriate  Insight: Appropriate  Engagement in Group:  Engaged  Modes of Intervention:  Discussion  Additional Comments:  Pt his day was 8. The one positive thing that happen went outside fresh air.  Charna Busman Long 01/17/2019, 4:29 AM

## 2019-01-17 NOTE — [facility] Suicide Risk Assessment (Signed)
[facility] INPATIENT:  Family/Significant Other Suicide Prevention Education  Suicide Prevention Education:  Education Completed; sister Tanner Andersen [phone] has been identified by the patient as the family member/significant other with whom the patient will be residing, and identified as the person(s) who will aid the patient in the event of a mental health crisis (suicidal ideations/suicide attempt).  With written consent from the patient, the family member/significant other has been provided the following suicide prevention education, prior to the and/or following the discharge of the patient.  The suicide prevention education provided includes the following:  Suicide risk factors  Suicide prevention and interventions  National Suicide Hotline telephone number  [facility] assessment telephone number  [facility] Emergency Assistance 911  [facility] and/or Residential Mobile Crisis Unit telephone number  Request made of family/significant other to:  Remove weapons (e.g., guns, rifles, knives), all items previously/currently identified as safety concern.    Remove drugs/medications (over-the-counter, prescriptions, illicit drugs), all items previously/currently identified as a safety concern.  The family member/significant other verbalizes understanding of the suicide prevention education information provided.  The family member/significant other agrees to remove the items of safety concern listed above.  Sister expresses some concerns for patient's safety, mostly related to the confusing circumstances in which patient presented to the medical hospital (conflicting reports of who contacted 911, patient states he has no recollection of how he got wounds to his chest or who called 911). Sister reports she went to the patient's home and noted there appeared to be a significant amount of blood. Sister wonders if it is a wise decision for patient to return to his home.  Sister  asked many thoughtful and practical questions related to how aftercare follow up and addiction support groups are operating now due to COVID 19. CSW shared that patient's appointments are scheduled to be completed through video apps. CSW encouraged sister and other family supports to call or text patient daily after discharge to offer support and encouragement, as he cited he was missing family visits in recent weeks. Sister agreeable and appreciative.   Tanner Andersen 01/17/2019, 12:53 PM

## 2019-01-17 NOTE — [facility] Group Notes (Signed)
[facility] Group Notes:  (Nursing/MHT/Case Management/Adjunct)  Date:  01/17/2019  Time:  4:00 PM  Type of Therapy:  Music Therapy  Participation Level:  Active  Participation Quality:  Appropriate, Sharing and Supportive  Affect:  Appropriate  Cognitive:  Alert and Appropriate  Insight:  Appropriate and Improving  Engagement in Group:  Developing/Improving and Engaged  Modes of Intervention:  Discussion and Socialization  Summary of Progress/Problems: Patients were invited to share a song with the group that is meaningful to them. Patients were asked why they chose the song and what memories it brought back. Patients also discussed team building.   [name] 01/17/2019, 5:38 PM 

## 2019-01-17 NOTE — [facility] Group Notes (Signed)
LCSW Group Therapy Notes 01/17/2019 1:52 PM  Type of Therapy and Topic: Group Therapy: Overcoming Obstacles  Participation Level: Did Not Attend  Description of Group:  In this group patients will be encouraged to explore what they see as obstacles to their own wellness and recovery. They will be guided to discuss their thoughts, feelings, and behaviors related to these obstacles. The group will process together ways to cope with barriers, with attention given to specific choices patients can make. Each patient will be challenged to identify changes they are motivated to make in order to overcome their obstacles. This group will be process-oriented, with patients participating in exploration of their own experiences as well as giving and receiving support and challenge from other group members.  Therapeutic Goals: 1. Patient will identify personal and current obstacles as they relate to admission. 2. Patient will identify barriers that currently interfere with their wellness or overcoming obstacles.  3. Patient will identify feelings, thought process and behaviors related to these barriers. 4. Patient will identify two changes they are willing to make to overcome these obstacles:   Summary of Patient Progress  Patient was having his wound dressings monitored and changed during group.  Therapeutic Modalities:  Cognitive Behavioral Therapy Solution Focused Therapy Motivational Interviewing Relapse Prevention Therapy  Enid Cutter, MSW, [facility] 01/17/2019 1:52 PM

## 2019-01-18 DIAGNOSIS — F332 Major depressive disorder, recurrent severe without psychotic features: Secondary | ICD-10-CM

## 2019-01-18 MED ORDER — LORATADINE 10 MG PO TABS
10.0000 mg | ORAL_TABLET | Freq: Every day | ORAL | Status: DC
  Administered 2019-01-18 – 2019-01-20 (×3): 10 mg via ORAL
  Filled 2019-01-18 (×5): qty 1

## 2019-01-18 NOTE — [facility] Group Notes (Signed)
LCSW Group Therapy Note  01/18/2019    10:00-11:00am   Type of Therapy and Topic:  Group Therapy: Early Messages Received About Anger  Participation Level:  Active   Description of Group:   In this group, patients shared and discussed the early messages received in their lives about anger through parental or other adult modeling, teaching, repression, punishment, violence, and more.  Participants identified how those childhood lessons influence even now how they usually or often react when angered.  The group discussed that anger is a secondary emotion and what may be the underlying emotional themes that come out through anger outbursts or that are ignored through anger suppression.  Finally, as a group there was a conversation about the workbook's quote that "There is nothing wrong with anger; it is just a sign something needs to change."     Therapeutic Goals: 1. Patients will identify one or more childhood message about anger that they received and how it was taught to them. 2. Patients will discuss how these childhood experiences have influenced and continue to influence their own expression or repression of anger even today. 3. Patients will explore possible primary emotions that tend to fuel their secondary emotion of anger. 4. Patients will learn that anger itself is normal and cannot be eliminated, and that healthier coping skills can assist with resolving conflict rather than worsening situations.  Summary of Patient Progress:  The patient shared that his childhood lessons about anger involved his older cousins and how they often fought to settle conflict.  As a result, he no longer solves problems this way. The patient recognizes that anger is a natural part of life and that how he responds can determine whether the outcome is positive or negative.  Therapeutic Modalities:   Cognitive Behavioral Therapy Motivation Interviewing   Henrene Dodge, LCSW
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
## 2019-01-18 NOTE — Progress Notes (Signed)
The focus of this group is to help patients establish daily goals to achieve during treatment and discuss how the patient can incorporate goal setting into their daily lives to aide in recovery. 

## 2019-01-19 MED ORDER — HYDROXYZINE HCL 25 MG PO TABS
25.0000 mg | ORAL_TABLET | Freq: Four times a day (QID) | ORAL | Status: DC | PRN
  Administered 2019-01-19: 25 mg via ORAL
  Filled 2019-01-19: qty 1
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
## 2019-01-19 NOTE — Progress Notes (Addendum)
D. Pt observed interacting well with peers and staff in the dayroom. Pt is friendly during interactions- reports improving mood and is looking forward to discharge. Per pt's self inventory, this am, pt rated his depression, hopelessness and anxiety an 8/3/3, respectively. Pt currently denies SI/HI and AV hallucinations. A. Labs and vitals monitored. Pt given and educated on medications. Pt supported emotionally and encouraged to express concerns and ask questions.   R. Pt remains safe with 15 minute checks. Will continue POC.

## 2019-01-19 NOTE — [facility] Group Notes (Signed)
[facility] LCSW Group Therapy Note  Date/Time:  01/19/2019 9:00-10:00 or 10:00-11:00AM  Type of Therapy and Topic:  Group Therapy:  Healthy and Unhealthy Supports  Participation Level:  Active   Description of Group:  Patients in this group were introduced to the idea of adding a variety of healthy supports to address the various needs in their lives.Patients discussed what additional healthy supports could be helpful in their recovery and wellness after discharge in order to prevent future hospitalizations.   An emphasis was placed on using counselor, doctor, therapy groups, 12-step groups, and problem-specific support groups to expand supports.  They also worked as a group on developing a specific plan for several patients to deal with unhealthy supports through boundary-setting, psychoeducation with loved ones, and even termination of relationships.   Therapeutic Goals:   1)  discuss importance of adding supports to stay well once out of the hospital  2)  compare healthy versus unhealthy supports and identify some examples of each  3)  generate ideas and descriptions of healthy supports that can be added  4)  offer mutual support about how to address unhealthy supports  5)  encourage active participation in and adherence to discharge plan    Summary of Patient Progress:  The patient stated that current healthy supports in his life are family and friends while current unhealthy supports include other so-called friends.  The patient expressed a willingness to add therapy and psychiatry as support(s) to help in his recovery journey.   Therapeutic Modalities:   Motivational Interviewing Brief Solution-Focused Therapy  Evorn Gong
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
## 2019-01-20 DIAGNOSIS — F1024 Alcohol dependence with alcohol-induced mood disorder: Secondary | ICD-10-CM

## 2019-01-20 MED ORDER — HYDROXYZINE HCL 25 MG PO TABS: 25.0000 mg | ORAL_TABLET | Freq: Four times a day (QID) | ORAL | 0 refills | Status: AC | PRN

## 2019-01-20 MED ORDER — BUSPIRONE HCL 10 MG PO TABS: 10.0000 mg | ORAL_TABLET | Freq: Two times a day (BID) | ORAL | 0 refills | Status: AC

## 2019-01-20 MED ORDER — CITALOPRAM HYDROBROMIDE 10 MG PO TABS: 10.0000 mg | ORAL_TABLET | Freq: Every day | ORAL | 0 refills | Status: AC

## 2019-01-20 MED ORDER — BACITRACIN-NEOMYCIN-POLYMYXIN OINTMENT TUBE: 1.0000 "application " | TOPICAL_OINTMENT | Freq: Three times a day (TID) | CUTANEOUS | Status: DC

## 2019-01-20 MED ORDER — ADULT MULTIVITAMIN W/MINERALS CH: 1.0000 | ORAL_TABLET | Freq: Every day | ORAL | Status: AC

## 2019-01-20 MED ORDER — LORATADINE 10 MG PO TABS: 10.0000 mg | ORAL_TABLET | Freq: Every day | ORAL | Status: AC

## 2019-01-20 MED ORDER — ACAMPROSATE CALCIUM 333 MG PO TBEC: 666.0000 mg | DELAYED_RELEASE_TABLET | Freq: Three times a day (TID) | ORAL | 0 refills | Status: AC

## 2019-01-20 MED ORDER — TRAZODONE HCL 50 MG PO TABS: 25.0000 mg | ORAL_TABLET | Freq: Every evening | ORAL | 0 refills | Status: AC | PRN

## 2019-01-20 NOTE — Progress Notes (Signed)
Recreation Therapy Notes  Date:  4.6.20 Time: 0930 Location: [address] Dayroom  Group Topic: Stress Management  Goal Area(s) Addresses:  Patient will identify positive stress management techniques. Patient will identify benefits of using stress management post d/c.  Intervention:  Stress Management  Activity :  Guided Imagery.  LRT introduced the stress management technique of guided imagery.  LRT read a script that guided patients in envisioning their peaceful place.  Patients were to listen and follow along as script was read to engage in activity.    Education:  Stress Management, Discharge Planning.   Education Outcome: Acknowledges Education  Clinical Observations/Feedback:  Pt did not attend group.    Caroll Rancher, LRT/CTRS     Caroll Rancher A 01/20/2019 10:39 AM

## 2019-01-20 NOTE — Progress Notes (Signed)
D:  Patient's self inventory sheet, patient sleeps good, sleep medication helpful.  Good appetite, normal energy level, good concentration.  Rated depression, anxiety, hopeless #3.  Withdrawals, tremors.  Denied SI.  Denied physical pain.  Goal is discharge and plan.  Will discuss plan.  Does have discharge plans. A:  Medications administered per MD orders.  Emotional support and encouragement given patient. R:  Denied SI and HI, contracts for safety.  Denied A/V hallucinations.  Safety maintained with 15 minute checks.

## 2019-01-20 NOTE — Discharge Summary (Addendum)
Physician Discharge Summary Note  Patient:  Tanner Andersen is an 58 y.o., male MRN:  [id number] DOB:  12-10-1960 Patient phone:  [phone] (home)  Patient address:   [address] [place] [address],  Total Time spent with patient: 15 minutes  Date of Admission:  01/15/2019 Date of Discharge: 01/20/19  Reason for Admission: stabbed self in chest while drinking  Principal Problem: MDD (major depressive disorder), severe (HCC) Discharge Diagnoses: Principal Problem:   MDD (major depressive disorder), severe (HCC) Active Problems:   Self-inflicted injury   Past Psychiatric History: Per admission H&P: no prior psychiatric admissions . Denies history of prior suicide attempts . Denies history of self cutting or self injurious behaviors .Denies clear history of mania or hypomania.  Reports history of depression, which he states is chronic and preceded onset of alcohol abuse . Does state it tends to worsen when drinking heavily. As above, describes history of PTSD symptoms. Denies history of violence . Denies panic or agoraphobia.  Past Medical History:  Past Medical History:  Diagnosis Date  . Depression    History reviewed. No pertinent surgical history. Family History: History reviewed. No pertinent family history. Family Psychiatric  History: Per admission H&P: States deceased brother had been diagnosed with Schizoaffective Disorder and that surviving sibling has been told he has Borderline Personality Disorder . No suicides in family. History of alcohol use disorder in the extended family ( uncles )  Social History:  Social History   Substance and Sexual Activity  Alcohol Use Yes     Social History   Substance and Sexual Activity  Drug Use Never    Social History   Socioeconomic History  . Marital status: Single    Spouse name: Not on file  . Number of children: Not on file  . Years of education: Not on file  . Highest education level: Not on file   Occupational History  . Not on file  Social Needs  . Financial resource strain: Not on file  . Food insecurity:    Worry: Not on file    Inability: Not on file  . Transportation needs:    Medical: Not on file    Non-medical: Not on file  Tobacco Use  . Smoking status: Current Every Day Smoker  . Smokeless tobacco: Never Used  Substance and Sexual Activity  . Alcohol use: Yes  . Drug use: Never  . Sexual activity: Not Currently  Lifestyle  . Physical activity:    Days per week: Not on file    Minutes per session: Not on file  . Stress: Not on file  Relationships  . Social connections:    Talks on phone: Not on file    Gets together: Not on file    Attends religious service: Not on file    Active member of club or organization: Not on file    Attends meetings of clubs or organizations: Not on file    Relationship status: Not on file  Other Topics Concern  . Not on file  Social History Narrative  . Not on file    Hospital Course:  From admission H&P 01/15/2019: 58 year old male, presented to ED via EMS following a self inflicted wound to chest . Patient states his recollection of this event is fragmented, and states he does remember having a knife but not stabbing self. He is also unsure how EMS was called. He was initially admitted to trauma inpatient unit for one day. Medically cleared and  transferred to inpatient psychiatry. He does endorse depression and describes contributing stressors including having employment difficulties . He works as a Data processing manager , has difficulty with the physical /lifting aspects of this job but has had difficulty finding another job. Also states that " when this coronavirus hit , I started feeling more anxious and stressed ", mainly because he has become more isolated , unable to visit parents or friends. He denies suicidal ideations leading up to day of admission. Does endorse some neuro-vegetative symptoms as below. Denies any psychotic symptoms.  He has been drinking daily, heavily, up to a fifth of vodka per day over recent weeks. Admission BAL 289, UDS was positive for BZDs and Opiates. He endorses being prescribed Xanax, which he does not take regularly. Denies any opiate use.  Mr. Tanner Andersen was admitted with self-inflicted stab wound to chest which he had done while drinking and suicidal. He was started on CIWA protocol with Ativan PRN CIWA>10 for alcohol withdrawal. Campral was started for alcohol cravings. Zoloft was discontinued. Celexa, Buspar, PRN trazodone, and PRN Vistaril were started. He participated in group therapy on the unit. He responded well to treatment with no adverse effects reported. He remained on the [facility] unit for 5 days. He stabilized with medication and therapy. He was discharged on the medications listed below. He has shown improvement with improved mood, affect, sleep, appetite, and interaction. He denies any SI/HI/AVH and contracts for safety. He agrees to follow up at the [facility] (see below). Patient is provided with prescriptions for medications upon discharge. His sister is picking him up for discharge to his parents' home.  Physical Findings: AIMS: Facial and Oral Movements Muscles of Facial Expression: None, normal Lips and Perioral Area: None, normal Jaw: None, normal Tongue: None, normal,Extremity Movements Upper (arms, wrists, hands, fingers): None, normal Lower (legs, knees, ankles, toes): None, normal, Trunk Movements Neck, shoulders, hips: None, normal, Overall Severity Severity of abnormal movements (highest score from questions above): None, normal Incapacitation due to abnormal movements: None, normal Patient's awareness of abnormal movements (rate only patient's report): No Awareness, Dental Status Current problems with teeth and/or dentures?: No Does patient usually wear dentures?: No  CIWA:  CIWA-Ar Total: 1 COWS:  COWS Total Score: 1  Musculoskeletal: Strength & Muscle Tone:  within normal limits Gait & Station: normal Patient leans: N/A  Psychiatric Specialty Exam: Physical Exam  Nursing note and vitals reviewed. Constitutional: He is oriented to person, place, and time. He appears well-developed.  Cardiovascular: Normal rate.  Respiratory: Effort normal.  Neurological: He is alert and oriented to person, place, and time.    Review of Systems  Constitutional: Negative.   Psychiatric/Behavioral: Positive for depression (improving) and substance abuse (ETOH). Negative for hallucinations, memory loss and suicidal ideas. The patient is not nervous/anxious and does not have insomnia.     Blood pressure 140/87, pulse 80, temperature 97.9 F (36.6 C), temperature source Oral, resp. rate 18, height 5\' 8"  (1.727 m), weight 117.9 kg.Body mass index is 39.53 kg/m.  See MD's discharge SRA     Have you used any form of tobacco in the last 30 days? (Cigarettes, Smokeless Tobacco, Cigars, and/or Pipes): No  Has this patient used any form of tobacco in the last 30 days? (Cigarettes, Smokeless Tobacco, Cigars, and/or Pipes) No  Blood Alcohol level:  Lab Results  Component Value Date   ETH 289 (H) 01/14/2019    Metabolic Disorder Labs:  Lab Results  Component Value Date   HGBA1C  5.7 (H) 01/16/2019   MPG 116.89 01/16/2019   No results found for: PROLACTIN Lab Results  Component Value Date   CHOL 183 01/16/2019   TRIG 197 (H) 01/16/2019   HDL 67 01/16/2019   CHOLHDL 2.7 01/16/2019   VLDL 39 01/16/2019   LDLCALC 77 01/16/2019    See Psychiatric Specialty Exam and Suicide Risk Assessment completed by Attending Physician prior to discharge.  Discharge destination:  Home  Is patient on multiple antipsychotic therapies at discharge:  No   Has Patient had three or more failed trials of antipsychotic monotherapy by history:  No  Recommended Plan for Multiple Antipsychotic Therapies: NA  Discharge Instructions    Discharge instructions   Complete by:  As  directed    Patient is instructed to take all prescribed medications as recommended. Report any side effects or adverse reactions to your outpatient psychiatrist. Patient is instructed to abstain from alcohol and illegal drugs while on prescription medications. In the event of worsening symptoms, patient is instructed to call the crisis hotline, 911, or go to the nearest emergency department for evaluation and treatment.     Allergies as of 01/20/2019   No Known Allergies     Medication List    STOP taking these medications   ALPRAZolam 1 MG tablet Commonly known as:  XANAX   sertraline 100 MG tablet Commonly known as:  ZOLOFT   zolpidem 10 MG tablet Commonly known as:  AMBIEN     TAKE these medications     Indication  acamprosate 333 MG tablet Commonly known as:  CAMPRAL Take 2 tablets (666 mg total) by mouth 3 (three) times daily with meals. For alcohol cravings  Indication:  Excessive Use of Alcohol   busPIRone 10 MG tablet Commonly known as:  BUSPAR Take 1 tablet (10 mg total) by mouth 2 (two) times daily. For anxiety  Indication:  Anxiety Disorder   citalopram 10 MG tablet Commonly known as:  CELEXA Take 1 tablet (10 mg total) by mouth daily. For mood Start taking on:  January 21, 2019  Indication:  Mood   hydrOXYzine 25 MG tablet Commonly known as:  ATARAX/VISTARIL Take 1 tablet (25 mg total) by mouth every 6 (six) hours as needed for anxiety.  Indication:  Feeling Anxious   loratadine 10 MG tablet Commonly known as:  CLARITIN Take 1 tablet (10 mg total) by mouth daily. Start taking on:  January 21, 2019  Indication:  Hayfever   multivitamin with minerals Tabs tablet Take 1 tablet by mouth daily. Start taking on:  January 21, 2019  Indication:  Supplementation   neomycin-bacitracin-polymyxin Oint Commonly known as:  NEOSPORIN Apply 1 application topically 3 (three) times daily.  Indication:  Wound   traZODone 50 MG tablet Commonly known as:  DESYREL Take  0.5 tablets (25 mg total) by mouth at bedtime as needed for sleep.  Indication:  Trouble Sleeping      Follow-up Information    [facility] Follow up on 01/22/2019.   Why:  Therapy appointment with Alyssa is Wednesday, 4/8 at 1:00p. Medication management appointment with Elon Jester is Thursday, 4/9 at 1:00p.  Appointments will be conducted over video chat Call within 24 hours of discharge to confirm appointments and pay deposit Contact information: [address] [place] [address] [phone]           Follow-up recommendations: Activity as tolerated. Diet as recommended by primary care physician. Keep all scheduled follow-up appointments as recommended.   Comments:  Patient is instructed to take all prescribed medications as recommended. Report any side effects or adverse reactions to your outpatient psychiatrist. Patient is instructed to abstain from alcohol and illegal drugs while on prescription medications. In the event of worsening symptoms, patient is instructed to call the crisis hotline, 911, or go to the nearest emergency department for evaluation and treatment.  Signed: Aldean BakerJanet E Sykes, NP 01/20/2019, 9:42 AM   Patient seen, Suicide Assessment Completed.  Disposition Plan Reviewed

## 2019-01-20 NOTE — Progress Notes (Signed)
D: Patient observed up and visible in the milieu. Working on puzzle with peers in [place]. Patient states, "I am feeling so much better the last 2 days. I've really turned a corner." Patient's affect anxious, mood congruent. Denies pain, physical complaints. Denies withdrawal.   A: Medicated per orders, prn vistaril and trazadone given to promote rest. Medication education provided. Level III obs in place for safety. Emotional support offered. Patient encouraged to complete Suicide Safety Plan before discharge. Encouraged to attend and participate in unit programming.   R: Patient verbalizes understanding of POC. On reassess, patient was resting in bed. Patient denies SI/HI/AVH and remains safe on level III obs. Will continue to monitor throughout the night.

## 2019-01-20 NOTE — Progress Notes (Signed)
Discharge Note:  Patient discharged home with family member.  Patient denied SI and HI.  Denied A/V hallucinations.  Suicide prevention information given and discussed with patient who stated he understood and had no questions.  Patient stated he received all his belongings, clothing, toiletries, misc items, etc.  Patient stated he appreciated all assistance received from [facility] staff.  All required discharge information given to patient at discharge.  Patient given My3 suicide prevention information at discharge.  Also survey given to patient before he was discharged.   

## 2019-01-20 NOTE — Progress Notes (Signed)
  [facility] Adult Case Management Discharge Plan :  Will you be returning to the same living situation after discharge:  No. Staying with parents for a couple days At discharge, do you have transportation home?: Yes,  sister picking up Do you have the ability to pay for your medications: Yes,  BCBS insurance and income from employment  Release of information consent forms completed and in the chart; work letters on chart. Patient to Follow up at: Follow-up Information    [facility] on 01/22/2019.   Why:  Therapy appointment with Alyssa is Wednesday, 4/8 at 1:00p. Medication management appointment with Elon Jester is Thursday, 4/9 at 1:00p.  Appointments will be conducted over video chat Call within 24 hours of discharge to confirm appointments and pay deposit Contact information: [address] [place] [address] [phone]           Next level of care provider has access to [facility] Link:no  Safety Planning and Suicide Prevention discussed: Yes,  with sister  Have you used any form of tobacco in the last 30 days? (Cigarettes, Smokeless Tobacco, Cigars, and/or Pipes): No  Has patient been referred to the Quitline?: N/A patient is not a smoker  Patient has been referred for addiction treatment: Yes  Darreld Mclean, LCSWA 01/20/2019, 9:08 AM

## 2019-01-20 NOTE — Progress Notes (Signed)
Adult Psychoeducational Group Note  Date:  01/20/2019 Time:  1:42 PM  Group Topic/Focus:  Wellness Toolbox:   The focus of this group is to discuss various aspects of wellness, balancing those aspects and exploring ways to increase the ability to experience wellness.  Patients will create a wellness toolbox for use upon discharge.  Participation Level:  Active  Participation Quality:  Appropriate  Affect:  Appropriate  Cognitive:  Alert  Insight: Appropriate  Engagement in Group:  Engaged  Modes of Intervention:  Discussion  Additional Comments:  Pt attended group and participated in discussion.  Pt states that he is feeling hopeful going forward and is going to stay with his parents for a while whom are his support system.  Fielding Mault R Khalee Mazo 01/20/2019, 1:42 PM

## 2019-01-20 NOTE — [facility] Suicide Risk Assessment (Signed)
[facility] Discharge Suicide Risk Assessment   Principal Problem: MDD (major depressive disorder), severe (HCC) Discharge Diagnoses: Principal Problem:   MDD (major depressive disorder), severe (HCC) Active Problems:   Self-inflicted injury   Total Time spent with patient: 30 minutes  Musculoskeletal: Strength & Muscle Tone: within normal limits minimal tremors, no restlessness, no psychomotor agitation or restlessness  Gait & Station: normal Patient leans: N/A  Psychiatric Specialty Exam: ROS denies headache, no visual disturbances, no chest pain, no shortness of breath or coughing, no fever or chills   Blood pressure 140/87, pulse 80, temperature 97.9 F (36.6 C), temperature source Oral, resp. rate 18, height 5\' 8"  (1.727 m), weight 117.9 kg.Body mass index is 39.53 kg/m.  General Appearance: Well Groomed  Eye Contact::  Good  Speech:  Normal Rate409  Volume:  Normal  Mood:  much improved, presents euthymic, states " I am feeling a lot better"  Affect:  Appropriate and Full Range  Thought Process:  Linear and Descriptions of Associations: Intact  Orientation:  Full (Time, Place, and Person)  Thought Content:  no hallucinations, no delusions, not internally preoccupied   Suicidal Thoughts:  No denies suicidal or self injurious ideations, no homicidal ideations  Homicidal Thoughts:  No  Memory:  recent and remote grossly intact   Judgement:  Other:  improving   Insight:  improving  Psychomotor Activity:  Normal  Concentration:  Good  Recall:  Good  Fund of Knowledge:Good  Language: Good  Akathisia:  Negative  Handed:  Right  AIMS (if indicated):     Assets:  Communication Skills Desire for Improvement Resilience  Sleep:  Number of Hours: 5.75  Cognition: WNL  ADL's:  Intact   Mental Status Per Nursing Assessment::   On Admission:  NA  Demographic Factors:  57, divorced, lives alone, employed  Loss Factors: Job related stressors,alcohol abuse   Historical  Factors: No prior psychiatric admissions , no prior suicide attempts, history of depression, history of alcohol use disorder  Risk Reduction Factors:   Sense of responsibility to family, Employed, Positive social support and Positive coping skills or problem solving skills  Continued Clinical Symptoms:  At this time patient is alert, attentive, well related, calm, mood improved , currently presents euthymic, affect appropriate, reactive, no thought disorder, no suicidal or self injurious ideations, no homicidal or violent ideations, no hallucinations , no delusions, not internally preoccupied, future oriented . Behavior on unit in good control No current significant symptoms of alcohol WDL- vitals currently stable Denies medication side effects. Side effects reviewed   Cognitive Features That Contribute To Risk:  No gross cognitive deficits noted upon discharge. Is alert , attentive, and oriented x 3   Suicide Risk:  Mild:  Suicidal ideation of limited frequency, intensity, duration, and specificity.  There are no identifiable plans, no associated intent, mild dysphoria and related symptoms, good self-control (both objective and subjective assessment), few other risk factors, and identifiable protective factors, including available and accessible social support.  Follow-up Information    [facility] Follow up on 01/22/2019.   Why:  Therapy appointment with Alyssa is Wednesday, 4/8 at 1:00p. Medication management appointment with Elon Jester is Thursday, 4/9 at 1:00p.  Appointments will be conducted over video chat Call within 24 hours of discharge to confirm appointments and pay deposit Contact information: [address] [place] [address] [phone]           Plan Of Care/Follow-up recommendations:  Activity:  as tolerated Diet:  heart healthy  Tests:  NA Other:  See below  Patient expresses readiness for discharge and is leaving unit in good spirits  Plans to  go live with his parents for added support Plans to follow up as above . Has an established PCP at [facility] for medical issues as needed   Craige Cotta, MD 01/20/2019, 9:32 AM

## 2019-02-11 ENCOUNTER — Ambulatory Visit
Admission: RE | Admit: 2019-02-11 | Discharge: 2019-02-11 | Disposition: A | Discharge status: Home / Self Care | Payer: BC Managed Care – PPO | Source: Ambulatory Visit | Attending: Family Medicine | Admitting: Family Medicine

## 2019-02-11 ENCOUNTER — Other Ambulatory Visit: Payer: Self-pay | Admitting: Family Medicine

## 2019-02-11 DIAGNOSIS — R05 Cough: Secondary | ICD-10-CM

## 2019-02-11 DIAGNOSIS — R059 Cough, unspecified: Secondary | ICD-10-CM

## 2019-03-26 ENCOUNTER — Telehealth: Payer: Self-pay | Admitting: Cardiovascular Disease

## 2019-03-26 NOTE — Telephone Encounter (Signed)
Smartphone/ consent/ my chart via emiled/ pre reg completed

## 2019-04-01 ENCOUNTER — Telehealth (INDEPENDENT_AMBULATORY_CARE_PROVIDER_SITE_OTHER): Payer: BC Managed Care – PPO | Admitting: Cardiovascular Disease

## 2019-04-01 ENCOUNTER — Encounter: Payer: Self-pay | Admitting: Cardiovascular Disease

## 2019-04-01 ENCOUNTER — Telehealth: Payer: Self-pay

## 2019-04-01 VITALS — BP 175/94 | Ht 68.0 in | Wt 275.0 lb

## 2019-04-01 DIAGNOSIS — Z01812 Encounter for preprocedural laboratory examination: Secondary | ICD-10-CM

## 2019-04-01 DIAGNOSIS — R0789 Other chest pain: Secondary | ICD-10-CM | POA: Diagnosis not present

## 2019-04-01 NOTE — Progress Notes (Signed)
Virtual Visit via Telephone Note   This visit type was conducted due to national recommendations for restrictions regarding the COVID-19 Pandemic (e.g. social distancing) in an effort to limit this patient's exposure and mitigate transmission in our community.  Due to his co-morbid illnesses, this patient is at least at moderate risk for complications without adequate follow up.  This format is felt to be most appropriate for this patient at this time.  The patient did not have access to video technology/had technical difficulties with video requiring transitioning to audio format only (telephone).  All issues noted in this document were discussed and addressed.  No physical exam could be performed with this format.  Please refer to the patient's chart for his  consent to telehealth [facility].   Date:  04/01/2019   ID:  Tanner AlconMichael Andersen, DOB [birth year], MRN [id number]  Patient Location: Home Provider Location: Home  PCP:  System, Pcp Not In  Cardiologist: Dr. Nanetta BattyJonathan  Electrophysiologist:  None   Evaluation Performed:  New Patient Evaluation  Chief Complaint: Chest pain  History of Present Illness:    Tanner AlconMichael Andersen is a 58 y.o. 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  He was referred by Dr.Koirala for cardiovascular valuation because of chest pain.  His only risk factor is family history with a brother who died at age 56 of questionable myocardial infarction.  He has no other cardiac risk factors.  He is never had a heart attack or stroke.  He does not smoke nor is he diabetic.  He had a failed attempt at committing suicide on the morning of 01/14/2019.  He tried to stab himself in the chest with a kitchen knife.  He went to [facility] see room and was hospitalized for 1 day.  Since that time he has had atypical chest pain occurring several times a day occurring just  above his wound and rating to his left axilla without other associated symptoms.  He does have a history of anxiety depression.  The patient does not have symptoms concerning for COVID-19 infection (fever, chills, cough, or new shortness of breath).    Past Medical History:  Diagnosis Date  . Depression    No past surgical history on file.   Current Meds  Medication Sig  . acamprosate (CAMPRAL) 333 MG tablet Take 2 tablets (666 mg total) by mouth 3 (three) times daily with meals. For alcohol cravings  . busPIRone (BUSPAR) 10 MG tablet Take 1 tablet (10 mg total) by mouth 2 (two) times daily. For anxiety  . citalopram (CELEXA) 10 MG tablet Take 1 tablet (10 mg total) by mouth daily. For mood  . hydrOXYzine (ATARAX/VISTARIL) 25 MG tablet Take 1 tablet (25 mg total) by mouth every 6 (six) hours as needed for anxiety.  Marland Kitchen. loratadine (CLARITIN) 10 MG tablet Take 1 tablet (10 mg total) by mouth daily.  . Multiple Vitamin (MULTIVITAMIN WITH MINERALS) TABS tablet Take 1 tablet by mouth daily.  . traZODone (DESYREL) 50 MG tablet Take 0.5 tablets (25 mg total) by mouth at bedtime as needed for sleep.     Allergies:   Patient has no known allergies.   Social History   Tobacco Use  . Smoking status: Never Smoker  . Smokeless tobacco: Never Used  Substance Use Topics  . Alcohol use: Yes  . Drug use: Never     Family Hx:  The patient's family history is not on file.  ROS:   Please see the history of present illness.     All other systems reviewed and are negative.   Prior CV studies:   The following studies were reviewed today:  None  Labs/Other Tests and Data Reviewed:    EKG:  An ECG dated 01/15/2019 was personally reviewed today and demonstrated:  Normal sinus rhythm at 95 with inferior Q waves  Recent Labs: 01/14/2019: ALT 24 01/15/2019: BUN 15; Creatinine, Ser 0.83; Hemoglobin 11.7; Platelets 158; Potassium 3.7; Sodium 135 01/16/2019: TSH 3.297   Recent Lipid Panel Lab Results   Component Value Date/Time   CHOL 183 01/16/2019 06:27 AM   TRIG 197 (H) 01/16/2019 06:27 AM   HDL 67 01/16/2019 06:27 AM   CHOLHDL 2.7 01/16/2019 06:27 AM   LDLCALC 77 01/16/2019 06:27 AM    Wt Readings from Last 3 Encounters:  04/01/19 275 lb (124.7 kg)  01/14/19 260 lb (117.9 kg)     Objective:    Vital Signs:  BP (!) 175/94   Ht 5\' 8"  (1.727 m)   Wt 275 lb (124.7 kg)   BMI 41.81 kg/m    VITAL SIGNS:  reviewed a complete physical exam was not performed today since this was a virtual telemedicine phone visit  ASSESSMENT & PLAN:    1. Atypical chest pain- history of atypical chest pain beginning after his failed suicide attempt on March 31.  The pain occurs several times a day, originates above his left chest wound with occasional radiation to the left axilla.  There are no other associated symptoms.  He has no other cardiac risk factors other than family history.  I am going to get a coronary CTA to further evaluate. 2. Essential hypertension- patient's blood pressure today was 175/94.  He is not on antihypertensive medications and says that his blood pressure usually is high initially and then falls down when checked subsequently.  This can be followed up by his PCP.  COVID-19 Education: The signs and symptoms of COVID-19 were discussed with the patient and how to seek care for testing (follow up with PCP or arrange E-visit).  The importance of social distancing was discussed today.  Time:   Today, I have spent 12 minutes with the patient with telehealth technology discussing the above problems.     Medication Adjustments/Labs and Tests Ordered: Current medicines are reviewed at length with the patient today.  Concerns regarding medicines are outlined above.   Tests Ordered: No orders of the defined types were placed in this encounter.   Medication Changes: No orders of the defined types were placed in this encounter.   Follow Up:  Virtual Visit prn  Signed,  Quay Burow, MD  04/01/2019 9:15 AM    [place]

## 2019-04-01 NOTE — Telephone Encounter (Signed)
Patient and/or DPR-approved person aware of 6/16 AVS instructions including hold Claritin and Vistaril 12 hours prior to coronary CTA and verbalized understanding. AVS EMAILED TO PT EMAIL ADDRESS ON FILE

## 2019-04-01 NOTE — Telephone Encounter (Signed)
Virtual Visit Pre-Appointment Phone Call  "(Name), I am calling you today to discuss your upcoming appointment. We are currently trying to limit exposure to the virus that causes COVID-19 by seeing patients at home rather than in the office."  1. "What is the BEST phone number to call the day of the visit?" - include this in appointment notes  2. "Do you have or have access to (through a family member/friend) a smartphone with video capability that we can use for your visit?" a. If yes - list this number in appt notes as "cell" (if different from BEST phone #) and list the appointment type as a VIDEO visit in appointment notes b. If no - list the appointment type as a PHONE visit in appointment notes  3. Confirm consent - "In the setting of the current Covid19 crisis, you are scheduled for a (phone or video) visit with your provider on (date) at (time).  Just as we do with many in-office visits, in order for you to participate in this visit, we must obtain consent.  If you'd like, I can send this to your mychart (if signed up) or email for you to review.  Otherwise, I can obtain your verbal consent now.  All virtual visits are billed to your insurance company just like a normal visit would be.  By agreeing to a virtual visit, we'd like you to understand that the technology does not allow for your provider to perform an examination, and thus may limit your provider's ability to fully assess your condition. If your provider identifies any concerns that need to be evaluated in person, we will make arrangements to do so.  Finally, though the technology is pretty good, we cannot assure that it will always work on either your or our end, and in the setting of a video visit, we may have to convert it to a phone-only visit.  In either situation, we cannot ensure that we have a secure connection.  Are you willing to proceed?" STAFF: Did the patient verbally acknowledge consent to telehealth visit? Document  YES/NO here: YES  4. Advise patient to be prepared - "Two hours prior to your appointment, go ahead and check your blood pressure, pulse, oxygen saturation, and your weight (if you have the equipment to check those) and write them all down. When your visit starts, your provider will ask you for this information. If you have an Apple Watch or Kardia device, please plan to have heart rate information ready on the day of your appointment. Please have a pen and paper handy nearby the day of the visit as well."  5. Give patient instructions for MyChart download to smartphone OR [email or web address].me as below if video visit (depending on what platform provider is using)  6. Inform patient they will receive a phone call 15 minutes prior to their appointment time (may be from unknown caller ID) so they should be prepared to answer    TELEPHONE CALL NOTE  Tanner Andersen has been deemed a candidate for a follow-up tele-health visit to limit community exposure during the Covid-19 pandemic. I spoke with the patient via phone to ensure availability of phone/video source, confirm preferred email & phone number, and discuss instructions and expectations.  I reminded Tanner Andersen to be prepared with any vital sign and/or heart rhythm information that could potentially be obtained via home monitoring, at the time of his visit. I reminded Tanner Andersen to expect a phone call prior to his visit.  Dorris Fetcherrah Tanner Andersen, CMA 04/01/2019 9:00 AM   INSTRUCTIONS FOR DOWNLOADING THE MYCHART APP TO SMARTPHONE  - The patient must first make sure to have activated MyChart and know their login information - If Apple, go to [employer] Store and type in MyChart in the search bar and download the app. If Android, ask patient to go to [employer] Play Store and type in HunterMyChart in the search bar and download the app. The app is free but as with any other app downloads, their phone may require them to verify saved payment information or Apple/Android  password.  - The patient will need to then log into the app with their MyChart username and password, and select [facility] as their healthcare provider to link the account. When it is time for your visit, go to the MyChart app, find appointments, and click Begin Video Visit. Be sure to Select Allow for your device to access the Microphone and Camera for your visit. You will then be connected, and your provider will be with you shortly.  **If they have any issues connecting, or need assistance please contact MyChart service desk [phone])**  **If using a computer, in order to ensure the best quality for their visit they will need to use either of the following Internet Browsers: [employer] Edge, or Google Chrome**  IF USING DOXIMITY or DOXY.ME - The patient will receive a link just prior to their visit by text.     FULL LENGTH CONSENT FOR TELE-HEALTH VISIT   I hereby voluntarily request, consent and authorize [facility] and its employed or contracted physicians, physician assistants, nurse practitioners or other licensed health care professionals (the Practitioner), to provide me with telemedicine health care services (the "Services") as deemed necessary by the treating Practitioner. I acknowledge and consent to receive the Services by the Practitioner via telemedicine. I understand that the telemedicine visit will involve communicating with the Practitioner through live audiovisual communication technology and the disclosure of certain medical information by electronic transmission. I acknowledge that I have been given the opportunity to request an in-person assessment or other available alternative prior to the telemedicine visit and am voluntarily participating in the telemedicine visit.  I understand that I have the right to withhold or withdraw my consent to the use of telemedicine in the course of my care at any time, without affecting my right to future care or treatment,  and that the Practitioner or I may terminate the telemedicine visit at any time. I understand that I have the right to inspect all information obtained and/or recorded in the course of the telemedicine visit and may receive copies of available information for a reasonable fee.  I understand that some of the potential risks of receiving the Services via telemedicine include:  Marland Kitchen. Delay or interruption in medical evaluation due to technological equipment failure or disruption; . Information transmitted may not be sufficient (e.g. poor resolution of images) to allow for appropriate medical decision making by the Practitioner; and/or  . In rare instances, security protocols could fail, causing a breach of personal health information.  Furthermore, I acknowledge that it is my responsibility to provide information about my medical history, conditions and care that is complete and accurate to the best of my ability. I acknowledge that Practitioner's advice, recommendations, and/or decision may be based on factors not within their control, such as incomplete or inaccurate data provided by me or distortions of diagnostic images or specimens that may result from electronic transmissions. I understand that the  practice of medicine is not an Chief Strategy Officer and that Practitioner makes no warranties or guarantees regarding treatment outcomes. I acknowledge that I will receive a copy of this consent concurrently upon execution via email to the email address I last provided but may also request a printed copy by calling the office of [facility].    I understand that my insurance will be billed for this visit.   I have read or had this consent read to me. . I understand the contents of this consent, which adequately explains the benefits and risks of the Services being provided via telemedicine.  . I have been provided ample opportunity to ask questions regarding this consent and the Services and have had my questions  answered to my satisfaction. . I give my informed consent for the services to be provided through the use of telemedicine in my medical care  By participating in this telemedicine visit I agree to the above.

## 2019-04-01 NOTE — Patient Instructions (Addendum)
Your physician recommends that you return for lab work 3-7 days prior to your coronary CTA. Lab work includes a BASIC METABOLIC PANEL AND COMPLETE BLOOD COUNT.   YOU WILL BE CONTACTED BY A SCHEDULER TO SET UP A DATE AND TIME FOR YOUR CORONARY CTA.  Please arrive at the [facility] main entrance of [facility] at xx:xx AM (30-45 minutes prior to test start time)  [facility] [place], [place] [address] [phone]  Proceed to the [facility] Radiology Department (First Floor).  Please follow these instructions carefully (unless otherwise directed):  Hold all erectile dysfunction medications at least 48 hours prior to test.  On the Night Before the Test: . Be sure to Drink plenty of water. . Do not consume any caffeinated/decaffeinated beverages or chocolate 12 hours prior to your test. . Do not take any antihistamines 12 hours prior to your test. This includes your Claritin and Vistaril.  On the Day of the Test: . Drink plenty of water. Do not drink any water within one hour of the test. . Do not eat any food 4 hours prior to the test. . You may take your regular medications prior to the test.  . Take metoprolol (Lopressor) 100 mg two hours prior to test.       After the Test: . Drink plenty of water. . After receiving IV contrast, you may experience a mild flushed feeling. This is normal. . On occasion, you may experience a mild rash up to 24 hours after the test. This is not dangerous. If this occurs, you can take Benadryl 25 mg and increase your fluid intake. . If you experience trouble breathing, this can be serious. If it is severe call 911 IMMEDIATELY. If it is mild, please call our office. . If you take any of these medications: Glipizide/Metformin, Avandament, Glucavance, please do not take 48 hours after completing test.   Follow-Up: At [facility], you and your health needs are our priority.  As part of our continuing mission to  provide you with exceptional heart care, we have created designated Provider Care Teams.  These Care Teams include your primary Cardiologist (physician) and Advanced Practice Providers (APPs -  Physician Assistants and Nurse Practitioners) who all work together to provide you with the care you need, when you need it. . You will need a follow up appointment AS NEEDED unless your results are abnormal. You may see Dr. Gwenlyn Found or one of the following Advanced Practice Providers on your designated Care Team:   . Kerin Ransom, [place] . Almyra Deforest, PA-C . Fabian Sharp, PA-C . Jory Sims, DNP . Rosaria Ferries, PA-C . Roby Lofts, PA-C . Sande Rives, PA-C

## 2019-04-16 ENCOUNTER — Telehealth (HOSPITAL_COMMUNITY): Payer: Self-pay | Admitting: Emergency Medicine

## 2019-04-16 NOTE — Telephone Encounter (Signed)
Reaching out to patient to offer assistance regarding upcoming cardiac imaging study; pt verbalizes understanding of appt date/time, parking situation and where to check in, pre-test NPO status and medications ordered, and verified current allergies; name and call back number provided for further questions should they arise Tanner Bond RN [facility] [phone] office [phone] cell  Pt denies covid symptoms, verbalized understanding of visitor policy.  Pt unaware to pick up medication from pharmacy. Pt states will do this this evening. appreciated phone call reminder

## 2019-04-17 ENCOUNTER — Ambulatory Visit (HOSPITAL_COMMUNITY): Payer: BC Managed Care – PPO

## 2019-04-17 ENCOUNTER — Ambulatory Visit (HOSPITAL_COMMUNITY)
Admission: RE | Admit: 2019-04-17 | Discharge: 2019-04-17 | Disposition: A | Discharge status: Home / Self Care | Payer: BC Managed Care – PPO | Source: Ambulatory Visit | Attending: Cardiovascular Disease | Admitting: Cardiovascular Disease

## 2019-04-17 ENCOUNTER — Other Ambulatory Visit: Payer: Self-pay

## 2019-04-17 DIAGNOSIS — R0789 Other chest pain: Secondary | ICD-10-CM

## 2019-04-17 MED ORDER — METOPROLOL TARTRATE 5 MG/5ML IV SOLN
INTRAVENOUS | Status: AC
  Filled 2019-04-17: qty 20

## 2019-04-17 MED ORDER — IOHEXOL 350 MG/ML SOLN
90.0000 mL | Freq: Once | INTRAVENOUS | Status: AC | PRN
  Administered 2019-04-17: 90 mL via INTRAVENOUS

## 2019-04-17 MED ORDER — METOPROLOL TARTRATE 5 MG/5ML IV SOLN
5.0000 mg | INTRAVENOUS | Status: DC | PRN
  Administered 2019-04-17: 5 mg via INTRAVENOUS
  Filled 2019-04-17: qty 5

## 2019-04-17 MED ORDER — NITROGLYCERIN 0.4 MG SL SUBL
0.8000 mg | SUBLINGUAL_TABLET | Freq: Once | SUBLINGUAL | Status: AC
  Administered 2019-04-17: 14:00:00 0.8 mg via SUBLINGUAL
  Filled 2019-04-17: qty 25

## 2019-04-17 MED ORDER — NITROGLYCERIN 0.4 MG SL SUBL
SUBLINGUAL_TABLET | SUBLINGUAL | Status: AC
  Filled 2019-04-17: qty 2

## 2019-04-21 ENCOUNTER — Telehealth: Payer: Self-pay

## 2019-04-21 NOTE — Telephone Encounter (Signed)
Called to review  results of coronary cta; left message stating that results released to mychart and pt may call back if any questions or concerns

## 2020-05-18 ENCOUNTER — Other Ambulatory Visit: Payer: Self-pay | Admitting: Neurosurgery

## 2020-05-18 DIAGNOSIS — M5412 Radiculopathy, cervical region: Secondary | ICD-10-CM

## 2020-06-12 ENCOUNTER — Ambulatory Visit
Admission: RE | Admit: 2020-06-12 | Discharge: 2020-06-12 | Disposition: A | Discharge status: Home / Self Care | Payer: BC Managed Care – PPO | Source: Ambulatory Visit | Attending: Neurosurgery | Admitting: Neurosurgery

## 2020-06-12 ENCOUNTER — Other Ambulatory Visit: Payer: Self-pay

## 2020-06-12 DIAGNOSIS — M5412 Radiculopathy, cervical region: Secondary | ICD-10-CM

## 2020-07-24 IMAGING — CT CT HEAR MORPH WITH CTA COR WITH SCORE WITH CA WITH CONTRAST AND
4 of 7 series · 8 of 20 positions shown, 9 images · non-contrast
Comparison: None.
COMPARISON: None.

Addendum:
EXAM:
OVER-READ INTERPRETATION  CT CHEST

The following report is an over-read performed by radiologist Dr.
Rustamfx Sapriina Sytle [REDACTED] on 04/17/2019. This
over-read does not include interpretation of cardiac or coronary
anatomy or pathology. The coronary calcium score/coronary CTA
interpretation by the cardiologist is attached.
CLINICAL DATA: 57-year-old male with atypical chest pain.
Cardiac/Coronary  CT
TECHNIQUE: The patient was scanned on a [employer] Force scanner.

[Series 6: best diast 72 % · axial · 0.39mm/px · z∈[+1215,+1255]mm · 2 of 303 slices shown, 3 images]
[im 101/303  vessel]
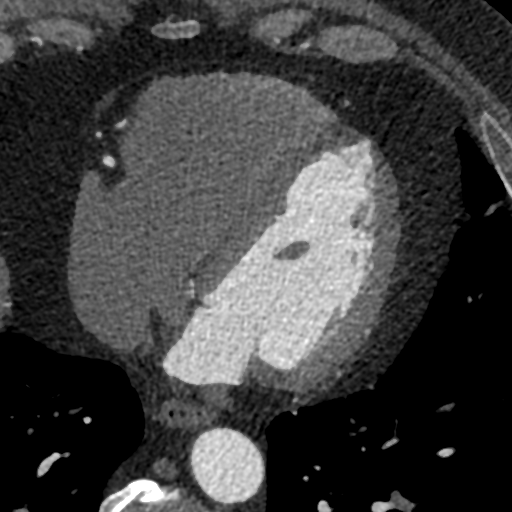
[im 101/303  lung]
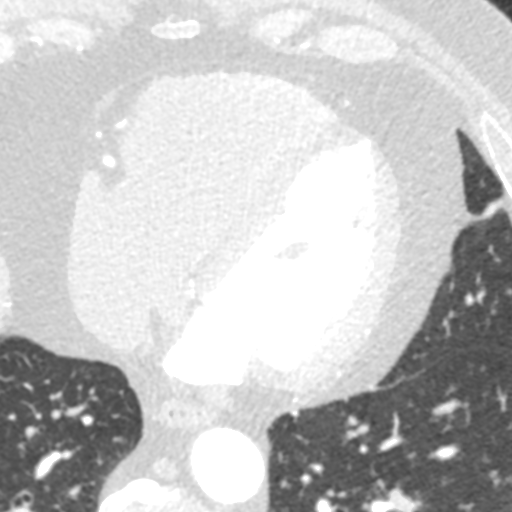
[im 202/303  vessel]
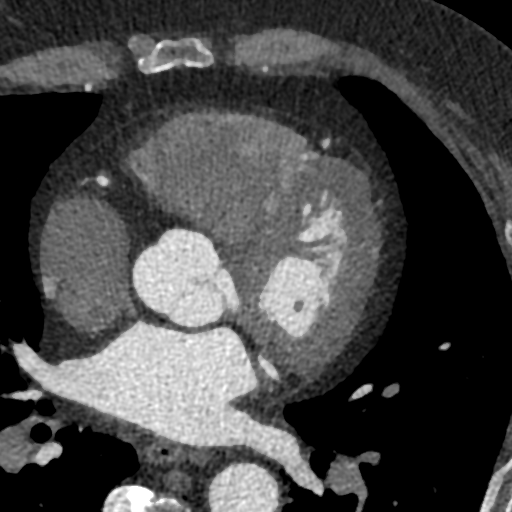

[Series 7: best syst 39 % · axial · 0.39mm/px · z∈[+1215,+1255]mm · 2 of 303 slices shown]
[im 101/303  vessel]
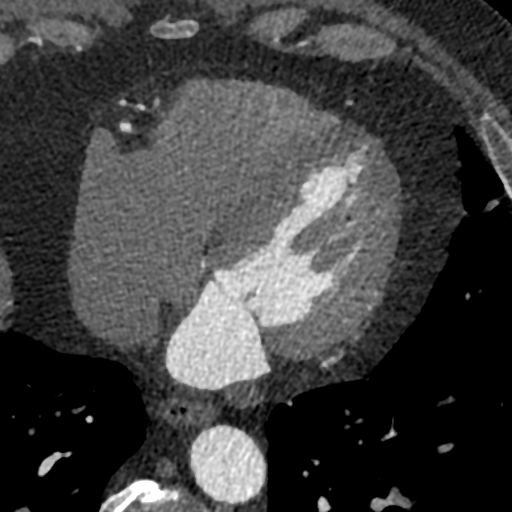
[im 202/303  vessel]
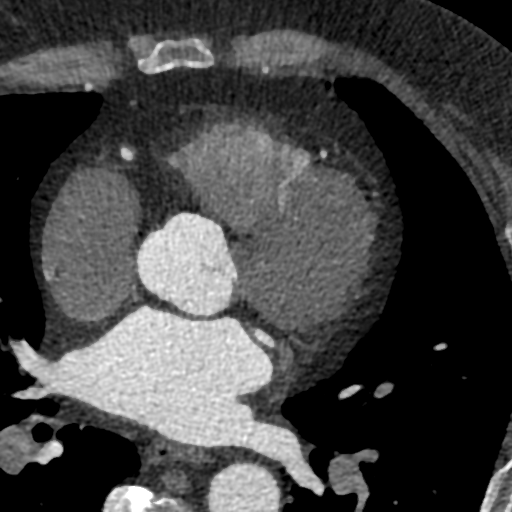

[Series 8: ts diast sharp 72 % · axial · 0.39mm/px · z∈[+1215,+1255]mm · 2 of 303 slices shown]
[im 101/303  lung]
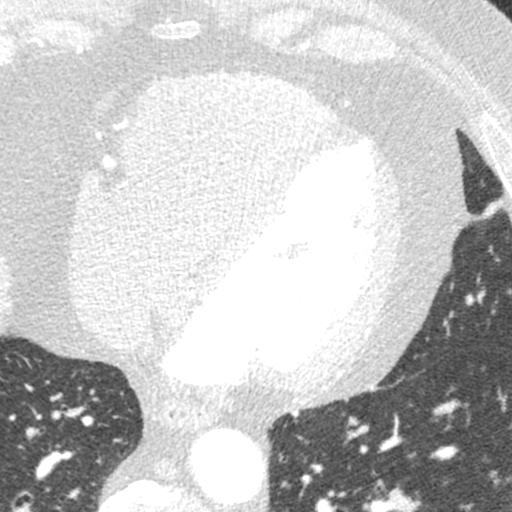
[im 202/303  lung]
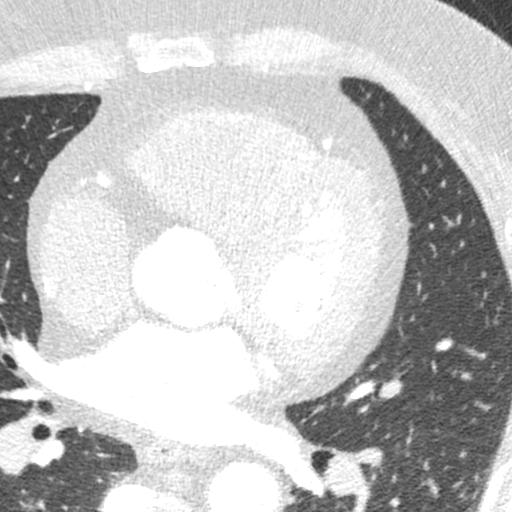

[Series 9: ts syst sharp 39 % · axial · 0.39mm/px · z∈[+1215,+1255]mm · 2 of 303 slices shown]
[im 101/303  lung]
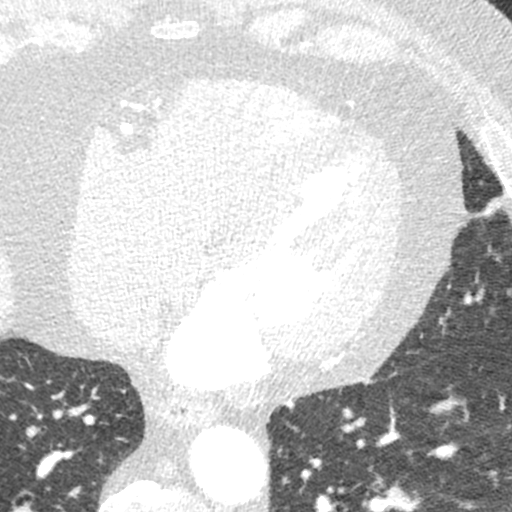
[im 202/303  lung]
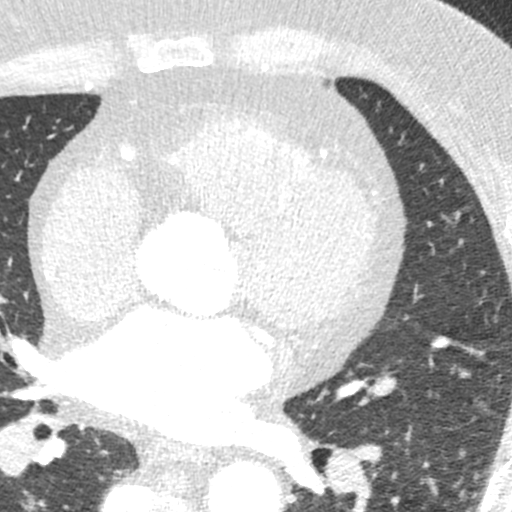

[8 of 20 positions shown; findings below may reference images not displayed]

FINDINGS: Within the visualized portions of the thorax there are no suspicious
appearing pulmonary nodules or masses, there is no acute
consolidative airspace disease, no pleural effusions, no
pneumothorax and no lymphadenopathy. Visualized portions of the
upper abdomen are unremarkable. There are no aggressive appearing
lytic or blastic lesions noted in the visualized portions of the
skeleton.
IMPRESSION: No significant incidental noncardiac findings are noted.
FINDINGS: A 120 kV prospective scan was triggered in the descending thoracic
aorta at 111 HU's. Axial non-contrast 3 mm slices were carried out
through the heart. The data set was analyzed on a dedicated work
station and scored using the Agatson method. Gantry rotation speed
was 250 msecs and collimation was .6 mm. No beta blockade and 0.8 mg
of sl NTG was given. The 3D data set was reconstructed in 5%
intervals of the 67-82 % of the R-R cycle. Diastolic phases were
analyzed on a dedicated work station using MPR, MIP and VRT modes.
The patient received 80 cc of contrast.

Aorta:  Normal size.  No calcifications.  No dissection.

Aortic Valve:  Trileaflet.  No calcifications.

Coronary Arteries:  Normal coronary origin.  Right dominance.

RCA is a large dominant artery that gives rise to PDA and PLVB.
There is no plaque.

Left main is a large artery that gives rise to LAD and LCX arteries.
Left main has no plaque.

LAD is a large vessel that gives rise to one small diagonal artery
and has no plaque.

LCX is a non-dominant artery that gives rise to one large OM1
branch. There is no plaque.

Other findings:

Normal pulmonary vein drainage into the left atrium.

Normal left atrial appendage without a thrombus.

Normal size of the pulmonary artery.
IMPRESSION: 1. Coronary calcium score of 0. This was 0 percentile for age and
sex matched control.

2. Normal coronary origin with right dominance.

3. No evidence of CAD. CAD RADS 0. Consider non-cardiac causes of
chest pain.

*** End of Addendum ***
EXAM:
OVER-READ INTERPRETATION  CT CHEST

The following report is an over-read performed by radiologist Dr.
Rustamfx Sapriina Sytle [REDACTED] on 04/17/2019. This
over-read does not include interpretation of cardiac or coronary
anatomy or pathology. The coronary calcium score/coronary CTA
interpretation by the cardiologist is attached.
FINDINGS: Within the visualized portions of the thorax there are no suspicious
appearing pulmonary nodules or masses, there is no acute
consolidative airspace disease, no pleural effusions, no
pneumothorax and no lymphadenopathy. Visualized portions of the
upper abdomen are unremarkable. There are no aggressive appearing
lytic or blastic lesions noted in the visualized portions of the
skeleton.
IMPRESSION: No significant incidental noncardiac findings are noted.

## 2020-07-27 ENCOUNTER — Other Ambulatory Visit: Payer: Self-pay | Admitting: Neurosurgery

## 2020-07-27 DIAGNOSIS — M5412 Radiculopathy, cervical region: Secondary | ICD-10-CM

## 2020-12-20 ENCOUNTER — Institutional Professional Consult (permissible substitution): Payer: Self-pay | Admitting: Neurology

## 2021-06-28 DIAGNOSIS — H353131 Nonexudative age-related macular degeneration, bilateral, early dry stage: Secondary | ICD-10-CM | POA: Diagnosis not present

## 2021-07-05 DIAGNOSIS — Z23 Encounter for immunization: Secondary | ICD-10-CM | POA: Diagnosis not present

## 2021-07-14 DIAGNOSIS — F4310 Post-traumatic stress disorder, unspecified: Secondary | ICD-10-CM | POA: Diagnosis not present

## 2021-07-14 DIAGNOSIS — F411 Generalized anxiety disorder: Secondary | ICD-10-CM | POA: Diagnosis not present

## 2021-07-14 DIAGNOSIS — F1021 Alcohol dependence, in remission: Secondary | ICD-10-CM | POA: Diagnosis not present

## 2021-07-14 DIAGNOSIS — F331 Major depressive disorder, recurrent, moderate: Secondary | ICD-10-CM | POA: Diagnosis not present

## 2021-07-26 DIAGNOSIS — H353131 Nonexudative age-related macular degeneration, bilateral, early dry stage: Secondary | ICD-10-CM | POA: Diagnosis not present

## 2021-10-06 DIAGNOSIS — F1021 Alcohol dependence, in remission: Secondary | ICD-10-CM | POA: Diagnosis not present

## 2021-10-06 DIAGNOSIS — F411 Generalized anxiety disorder: Secondary | ICD-10-CM | POA: Diagnosis not present

## 2021-10-06 DIAGNOSIS — F331 Major depressive disorder, recurrent, moderate: Secondary | ICD-10-CM | POA: Diagnosis not present

## 2021-10-06 DIAGNOSIS — F4310 Post-traumatic stress disorder, unspecified: Secondary | ICD-10-CM | POA: Diagnosis not present

## 2021-11-16 DIAGNOSIS — D1801 Hemangioma of skin and subcutaneous tissue: Secondary | ICD-10-CM | POA: Diagnosis not present

## 2021-11-16 DIAGNOSIS — D225 Melanocytic nevi of trunk: Secondary | ICD-10-CM | POA: Diagnosis not present

## 2021-11-16 DIAGNOSIS — D485 Neoplasm of uncertain behavior of skin: Secondary | ICD-10-CM | POA: Diagnosis not present

## 2021-11-16 DIAGNOSIS — L821 Other seborrheic keratosis: Secondary | ICD-10-CM | POA: Diagnosis not present

## 2021-11-16 DIAGNOSIS — L570 Actinic keratosis: Secondary | ICD-10-CM | POA: Diagnosis not present

## 2021-11-16 DIAGNOSIS — D2261 Melanocytic nevi of right upper limb, including shoulder: Secondary | ICD-10-CM | POA: Diagnosis not present

## 2021-12-12 DIAGNOSIS — Z0000 Encounter for general adult medical examination without abnormal findings: Secondary | ICD-10-CM | POA: Diagnosis not present

## 2021-12-12 DIAGNOSIS — E7800 Pure hypercholesterolemia, unspecified: Secondary | ICD-10-CM | POA: Diagnosis not present

## 2021-12-12 DIAGNOSIS — E1165 Type 2 diabetes mellitus with hyperglycemia: Secondary | ICD-10-CM | POA: Diagnosis not present

## 2021-12-12 DIAGNOSIS — F418 Other specified anxiety disorders: Secondary | ICD-10-CM | POA: Diagnosis not present

## 2021-12-12 DIAGNOSIS — Z125 Encounter for screening for malignant neoplasm of prostate: Secondary | ICD-10-CM | POA: Diagnosis not present

## 2021-12-12 DIAGNOSIS — Z1329 Encounter for screening for other suspected endocrine disorder: Secondary | ICD-10-CM | POA: Diagnosis not present

## 2021-12-12 DIAGNOSIS — E119 Type 2 diabetes mellitus without complications: Secondary | ICD-10-CM | POA: Diagnosis not present

## 2022-01-04 DIAGNOSIS — F331 Major depressive disorder, recurrent, moderate: Secondary | ICD-10-CM | POA: Diagnosis not present

## 2022-01-04 DIAGNOSIS — F411 Generalized anxiety disorder: Secondary | ICD-10-CM | POA: Diagnosis not present

## 2022-01-04 DIAGNOSIS — F1021 Alcohol dependence, in remission: Secondary | ICD-10-CM | POA: Diagnosis not present

## 2022-01-04 DIAGNOSIS — F4310 Post-traumatic stress disorder, unspecified: Secondary | ICD-10-CM | POA: Diagnosis not present

## 2022-03-30 DIAGNOSIS — F411 Generalized anxiety disorder: Secondary | ICD-10-CM | POA: Diagnosis not present

## 2022-03-30 DIAGNOSIS — F4310 Post-traumatic stress disorder, unspecified: Secondary | ICD-10-CM | POA: Diagnosis not present

## 2022-03-30 DIAGNOSIS — F1021 Alcohol dependence, in remission: Secondary | ICD-10-CM | POA: Diagnosis not present

## 2022-03-30 DIAGNOSIS — F331 Major depressive disorder, recurrent, moderate: Secondary | ICD-10-CM | POA: Diagnosis not present

## 2022-05-24 DIAGNOSIS — G250 Essential tremor: Secondary | ICD-10-CM | POA: Diagnosis not present

## 2022-05-24 DIAGNOSIS — E7800 Pure hypercholesterolemia, unspecified: Secondary | ICD-10-CM | POA: Diagnosis not present

## 2022-05-24 DIAGNOSIS — E119 Type 2 diabetes mellitus without complications: Secondary | ICD-10-CM | POA: Diagnosis not present

## 2022-05-24 DIAGNOSIS — G454 Transient global amnesia: Secondary | ICD-10-CM | POA: Diagnosis not present

## 2022-05-26 ENCOUNTER — Other Ambulatory Visit: Payer: Self-pay | Admitting: Family Medicine

## 2022-05-26 DIAGNOSIS — G454 Transient global amnesia: Secondary | ICD-10-CM

## 2022-05-29 ENCOUNTER — Other Ambulatory Visit: Payer: Self-pay | Admitting: Family Medicine

## 2022-05-29 DIAGNOSIS — G454 Transient global amnesia: Secondary | ICD-10-CM

## 2022-06-06 ENCOUNTER — Ambulatory Visit
Admission: RE | Admit: 2022-06-06 | Discharge: 2022-06-06 | Disposition: A | Discharge status: Home / Self Care | Payer: BC Managed Care – PPO | Source: Ambulatory Visit | Attending: Family Medicine | Admitting: Family Medicine

## 2022-06-06 DIAGNOSIS — R413 Other amnesia: Secondary | ICD-10-CM | POA: Diagnosis not present

## 2022-06-06 DIAGNOSIS — G454 Transient global amnesia: Secondary | ICD-10-CM

## 2022-06-12 DIAGNOSIS — E7800 Pure hypercholesterolemia, unspecified: Secondary | ICD-10-CM | POA: Diagnosis not present

## 2022-06-12 DIAGNOSIS — F418 Other specified anxiety disorders: Secondary | ICD-10-CM | POA: Diagnosis not present

## 2022-06-12 DIAGNOSIS — G454 Transient global amnesia: Secondary | ICD-10-CM | POA: Diagnosis not present

## 2022-06-12 DIAGNOSIS — G250 Essential tremor: Secondary | ICD-10-CM | POA: Diagnosis not present

## 2022-06-26 DIAGNOSIS — F1021 Alcohol dependence, in remission: Secondary | ICD-10-CM | POA: Diagnosis not present

## 2022-06-26 DIAGNOSIS — F331 Major depressive disorder, recurrent, moderate: Secondary | ICD-10-CM | POA: Diagnosis not present

## 2022-06-26 DIAGNOSIS — F411 Generalized anxiety disorder: Secondary | ICD-10-CM | POA: Diagnosis not present

## 2022-06-26 DIAGNOSIS — F4310 Post-traumatic stress disorder, unspecified: Secondary | ICD-10-CM | POA: Diagnosis not present

## 2022-07-13 DIAGNOSIS — Z23 Encounter for immunization: Secondary | ICD-10-CM | POA: Diagnosis not present

## 2022-09-21 DIAGNOSIS — F4310 Post-traumatic stress disorder, unspecified: Secondary | ICD-10-CM | POA: Diagnosis not present

## 2022-09-21 DIAGNOSIS — F411 Generalized anxiety disorder: Secondary | ICD-10-CM | POA: Diagnosis not present

## 2022-09-21 DIAGNOSIS — F1021 Alcohol dependence, in remission: Secondary | ICD-10-CM | POA: Diagnosis not present

## 2022-09-21 DIAGNOSIS — F331 Major depressive disorder, recurrent, moderate: Secondary | ICD-10-CM | POA: Diagnosis not present

## 2022-12-05 DIAGNOSIS — E119 Type 2 diabetes mellitus without complications: Secondary | ICD-10-CM | POA: Diagnosis not present

## 2022-12-05 DIAGNOSIS — Z1329 Encounter for screening for other suspected endocrine disorder: Secondary | ICD-10-CM | POA: Diagnosis not present

## 2022-12-05 DIAGNOSIS — R3915 Urgency of urination: Secondary | ICD-10-CM | POA: Diagnosis not present

## 2022-12-05 DIAGNOSIS — Z23 Encounter for immunization: Secondary | ICD-10-CM | POA: Diagnosis not present

## 2022-12-05 DIAGNOSIS — Z125 Encounter for screening for malignant neoplasm of prostate: Secondary | ICD-10-CM | POA: Diagnosis not present

## 2022-12-05 DIAGNOSIS — Z0000 Encounter for general adult medical examination without abnormal findings: Secondary | ICD-10-CM | POA: Diagnosis not present

## 2022-12-05 DIAGNOSIS — F418 Other specified anxiety disorders: Secondary | ICD-10-CM | POA: Diagnosis not present

## 2022-12-05 DIAGNOSIS — E7800 Pure hypercholesterolemia, unspecified: Secondary | ICD-10-CM | POA: Diagnosis not present

## 2022-12-12 DIAGNOSIS — L570 Actinic keratosis: Secondary | ICD-10-CM | POA: Diagnosis not present

## 2022-12-12 DIAGNOSIS — L438 Other lichen planus: Secondary | ICD-10-CM | POA: Diagnosis not present

## 2022-12-12 DIAGNOSIS — Z85828 Personal history of other malignant neoplasm of skin: Secondary | ICD-10-CM | POA: Diagnosis not present

## 2022-12-12 DIAGNOSIS — L720 Epidermal cyst: Secondary | ICD-10-CM | POA: Diagnosis not present

## 2022-12-12 DIAGNOSIS — D2262 Melanocytic nevi of left upper limb, including shoulder: Secondary | ICD-10-CM | POA: Diagnosis not present

## 2022-12-19 DIAGNOSIS — F4310 Post-traumatic stress disorder, unspecified: Secondary | ICD-10-CM | POA: Diagnosis not present

## 2022-12-19 DIAGNOSIS — F1021 Alcohol dependence, in remission: Secondary | ICD-10-CM | POA: Diagnosis not present

## 2022-12-19 DIAGNOSIS — F331 Major depressive disorder, recurrent, moderate: Secondary | ICD-10-CM | POA: Diagnosis not present

## 2022-12-19 DIAGNOSIS — F411 Generalized anxiety disorder: Secondary | ICD-10-CM | POA: Diagnosis not present

## 2022-12-27 DIAGNOSIS — G4733 Obstructive sleep apnea (adult) (pediatric): Secondary | ICD-10-CM | POA: Diagnosis not present

## 2023-01-18 DIAGNOSIS — G4733 Obstructive sleep apnea (adult) (pediatric): Secondary | ICD-10-CM | POA: Diagnosis not present

## 2023-03-19 DIAGNOSIS — F4310 Post-traumatic stress disorder, unspecified: Secondary | ICD-10-CM | POA: Diagnosis not present

## 2023-03-19 DIAGNOSIS — F1021 Alcohol dependence, in remission: Secondary | ICD-10-CM | POA: Diagnosis not present

## 2023-03-19 DIAGNOSIS — F331 Major depressive disorder, recurrent, moderate: Secondary | ICD-10-CM | POA: Diagnosis not present

## 2023-03-19 DIAGNOSIS — F411 Generalized anxiety disorder: Secondary | ICD-10-CM | POA: Diagnosis not present

## 2023-05-17 DIAGNOSIS — G4733 Obstructive sleep apnea (adult) (pediatric): Secondary | ICD-10-CM | POA: Diagnosis not present

## 2023-05-23 DIAGNOSIS — Z79899 Other long term (current) drug therapy: Secondary | ICD-10-CM | POA: Diagnosis not present

## 2023-05-30 DIAGNOSIS — G4733 Obstructive sleep apnea (adult) (pediatric): Secondary | ICD-10-CM | POA: Diagnosis not present

## 2023-06-05 DIAGNOSIS — E7800 Pure hypercholesterolemia, unspecified: Secondary | ICD-10-CM | POA: Diagnosis not present

## 2023-06-05 DIAGNOSIS — E119 Type 2 diabetes mellitus without complications: Secondary | ICD-10-CM | POA: Diagnosis not present

## 2023-06-05 DIAGNOSIS — F331 Major depressive disorder, recurrent, moderate: Secondary | ICD-10-CM | POA: Diagnosis not present

## 2023-06-14 DIAGNOSIS — F411 Generalized anxiety disorder: Secondary | ICD-10-CM | POA: Diagnosis not present

## 2023-06-14 DIAGNOSIS — F4310 Post-traumatic stress disorder, unspecified: Secondary | ICD-10-CM | POA: Diagnosis not present

## 2023-06-14 DIAGNOSIS — F331 Major depressive disorder, recurrent, moderate: Secondary | ICD-10-CM | POA: Diagnosis not present

## 2023-06-14 DIAGNOSIS — F1021 Alcohol dependence, in remission: Secondary | ICD-10-CM | POA: Diagnosis not present

## 2023-08-10 DIAGNOSIS — E119 Type 2 diabetes mellitus without complications: Secondary | ICD-10-CM | POA: Diagnosis not present

## 2023-08-10 DIAGNOSIS — Z79899 Other long term (current) drug therapy: Secondary | ICD-10-CM | POA: Diagnosis not present

## 2023-09-04 DIAGNOSIS — F1021 Alcohol dependence, in remission: Secondary | ICD-10-CM | POA: Diagnosis not present

## 2023-09-04 DIAGNOSIS — F411 Generalized anxiety disorder: Secondary | ICD-10-CM | POA: Diagnosis not present

## 2023-09-04 DIAGNOSIS — F4310 Post-traumatic stress disorder, unspecified: Secondary | ICD-10-CM | POA: Diagnosis not present

## 2023-09-04 DIAGNOSIS — F331 Major depressive disorder, recurrent, moderate: Secondary | ICD-10-CM | POA: Diagnosis not present

## 2023-09-19 DIAGNOSIS — E119 Type 2 diabetes mellitus without complications: Secondary | ICD-10-CM | POA: Diagnosis not present

## 2023-09-19 DIAGNOSIS — Z79899 Other long term (current) drug therapy: Secondary | ICD-10-CM | POA: Diagnosis not present
